# Patient Record
Sex: Male | Born: 1945 | Race: White | Hispanic: No | Marital: Married | Smoking: Former smoker
Health system: Southern US, Community
[De-identification: ages and names within clinical notes are randomized; demographics above are authoritative.]

## PROBLEM LIST (undated history)

## (undated) DIAGNOSIS — R1310 Dysphagia, unspecified: Secondary | ICD-10-CM

## (undated) DIAGNOSIS — K219 Gastro-esophageal reflux disease without esophagitis: Secondary | ICD-10-CM

## (undated) DIAGNOSIS — E7800 Pure hypercholesterolemia, unspecified: Secondary | ICD-10-CM

## (undated) DIAGNOSIS — I10 Essential (primary) hypertension: Secondary | ICD-10-CM

## (undated) DIAGNOSIS — T8859XA Other complications of anesthesia, initial encounter: Secondary | ICD-10-CM

## (undated) DIAGNOSIS — C329 Malignant neoplasm of larynx, unspecified: Secondary | ICD-10-CM

## (undated) DIAGNOSIS — E039 Hypothyroidism, unspecified: Secondary | ICD-10-CM

## (undated) HISTORY — DX: Dysphagia, unspecified: R13.10

## (undated) HISTORY — DX: Malignant neoplasm of larynx, unspecified: C32.9

## (undated) HISTORY — PX: OTHER SURGICAL HISTORY: SHX169

## (undated) HISTORY — DX: Hypothyroidism, unspecified: E03.9

## (undated) HISTORY — PX: HEMORRHOID SURGERY: SHX153

## (undated) HISTORY — DX: Pure hypercholesterolemia, unspecified: E78.00

---

## 2001-10-23 ENCOUNTER — Encounter: Payer: Self-pay | Admitting: Otolaryngology

## 2001-10-23 ENCOUNTER — Ambulatory Visit: Admission: RE | Admit: 2001-10-23 | Discharge: 2002-01-21 | Payer: Self-pay | Admitting: Radiation Oncology

## 2001-10-23 ENCOUNTER — Ambulatory Visit (HOSPITAL_COMMUNITY): Admission: RE | Admit: 2001-10-23 | Discharge: 2001-10-23 | Payer: Self-pay | Admitting: Otolaryngology

## 2001-10-31 ENCOUNTER — Encounter (INDEPENDENT_AMBULATORY_CARE_PROVIDER_SITE_OTHER): Payer: Self-pay | Admitting: Specialist

## 2001-10-31 ENCOUNTER — Inpatient Hospital Stay (HOSPITAL_COMMUNITY): Admission: RE | Admit: 2001-10-31 | Discharge: 2001-11-01 | Payer: Self-pay | Admitting: Otolaryngology

## 2002-01-08 ENCOUNTER — Encounter (HOSPITAL_COMMUNITY): Admission: RE | Admit: 2002-01-08 | Discharge: 2002-04-08 | Payer: Self-pay | Admitting: Dentistry

## 2002-01-10 ENCOUNTER — Encounter (HOSPITAL_COMMUNITY): Payer: Self-pay | Admitting: Dentistry

## 2002-04-02 ENCOUNTER — Encounter: Admission: RE | Admit: 2002-04-02 | Discharge: 2002-04-02 | Payer: Self-pay | Admitting: Otolaryngology

## 2002-04-02 ENCOUNTER — Encounter: Payer: Self-pay | Admitting: Otolaryngology

## 2002-08-02 ENCOUNTER — Ambulatory Visit (HOSPITAL_COMMUNITY): Admission: RE | Admit: 2002-08-02 | Discharge: 2002-08-02 | Payer: Self-pay | Admitting: Dentistry

## 2002-11-02 ENCOUNTER — Encounter: Payer: Self-pay | Admitting: Otolaryngology

## 2002-11-02 ENCOUNTER — Encounter: Admission: RE | Admit: 2002-11-02 | Discharge: 2002-11-02 | Payer: Self-pay | Admitting: Otolaryngology

## 2003-04-10 ENCOUNTER — Encounter: Admission: RE | Admit: 2003-04-10 | Discharge: 2003-04-10 | Payer: Self-pay | Admitting: Otolaryngology

## 2003-04-10 ENCOUNTER — Encounter: Payer: Self-pay | Admitting: Otolaryngology

## 2003-08-06 ENCOUNTER — Encounter (INDEPENDENT_AMBULATORY_CARE_PROVIDER_SITE_OTHER): Payer: Self-pay | Admitting: *Deleted

## 2003-08-06 ENCOUNTER — Observation Stay (HOSPITAL_COMMUNITY): Admission: RE | Admit: 2003-08-06 | Discharge: 2003-08-07 | Payer: Self-pay | Admitting: General Surgery

## 2003-08-07 ENCOUNTER — Encounter (INDEPENDENT_AMBULATORY_CARE_PROVIDER_SITE_OTHER): Payer: Self-pay | Admitting: Specialist

## 2003-11-07 ENCOUNTER — Encounter: Admission: RE | Admit: 2003-11-07 | Discharge: 2003-11-07 | Payer: Self-pay | Admitting: Otolaryngology

## 2004-06-16 ENCOUNTER — Encounter: Admission: RE | Admit: 2004-06-16 | Discharge: 2004-06-16 | Payer: Self-pay | Admitting: Otolaryngology

## 2004-11-24 ENCOUNTER — Encounter: Admission: RE | Admit: 2004-11-24 | Discharge: 2004-11-24 | Payer: Self-pay | Admitting: Otolaryngology

## 2008-02-06 ENCOUNTER — Ambulatory Visit (HOSPITAL_COMMUNITY): Admission: RE | Admit: 2008-02-06 | Discharge: 2008-02-06 | Payer: Self-pay | Admitting: General Surgery

## 2008-02-06 ENCOUNTER — Encounter (INDEPENDENT_AMBULATORY_CARE_PROVIDER_SITE_OTHER): Payer: Self-pay | Admitting: General Surgery

## 2009-06-12 IMAGING — CR DG ABD PORTABLE 1V
1 series · 1 of 1 positions shown · non-contrast
Comparison: none

HISTORY: Colonic polyps, colonoscopy, scope position

PORTABLE ABDOMEN ONE VIEW:
Portable exam 3333 hours.
Endoscope is coiled in left abdomen with tip in superior right upper quadrant at
expected position of hepatic flexure.
Air filled loops of bowel compatible with insufflation for endoscopy.
Bones unremarkable.

[view not recorded]
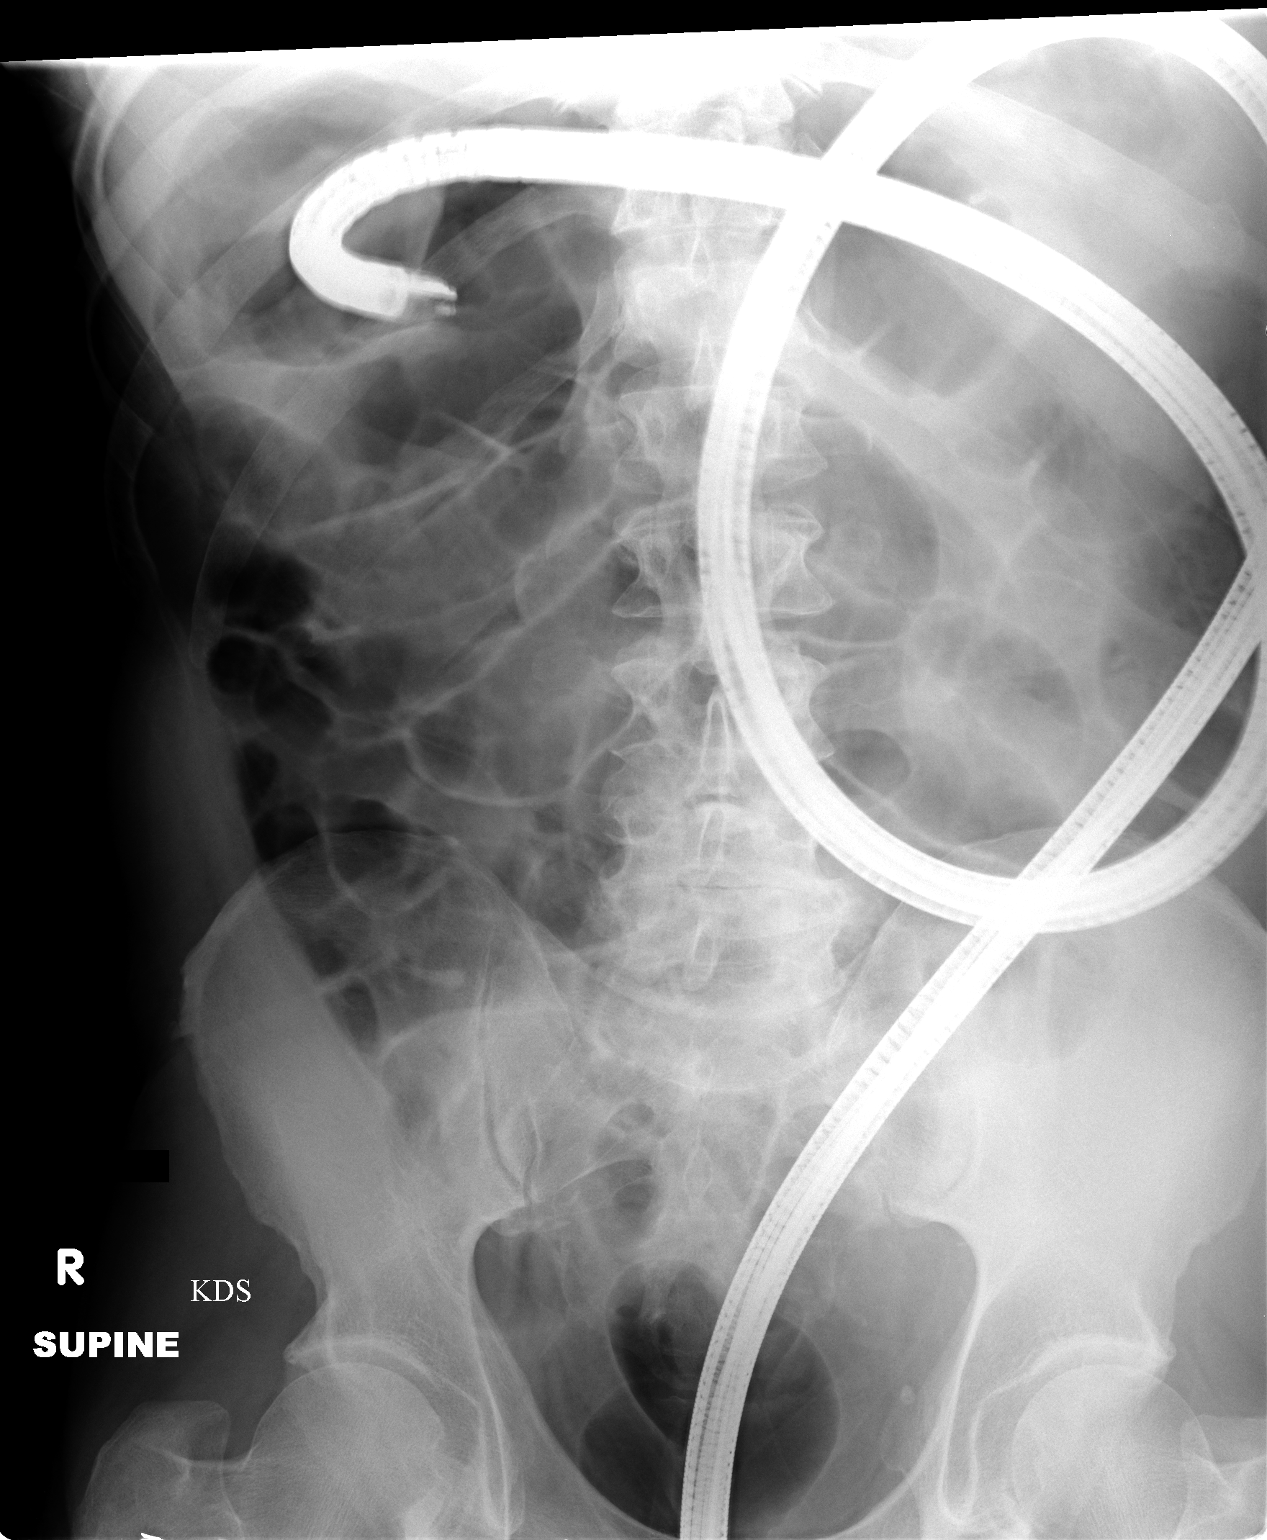

[1 of 1 positions shown; findings below may reference images not displayed]

IMPRESSION: Tip of colonoscoped is in the right upper quadrant projecting over the expected
position of the hepatic flexure.

## 2011-05-14 NOTE — Op Note (Signed)
NAME:  Justin Gates, Justin Gates                          ACCOUNT NO.:  [id number]   MEDICAL RECORD NO.:  [id number]                   PATIENT TYPE:  OBV   LOCATION:  0478                                 FACILITY:  [facility]   PHYSICIAN:  Timothy E. Earlene Plater, M.D.              DATE OF BIRTH:  [birth year]   DATE OF PROCEDURE:  08/06/2003  DATE OF DISCHARGE:                                 OPERATIVE REPORT   PREOPERATIVE DIAGNOSIS:  Internal and external hemorrhoids.   POSTOPERATIVE DIAGNOSIS:  Internal and external hemorrhoids.   PROCEDURE:  Complex hemorrhoidectomy.   SURGEON:  Timothy E. Earlene Plater, M.D.   ANESTHESIA:  General.   Mr. Dobler is 46, otherwise healthy.  He has been treated for carcinoma of  the larynx.  He has longstanding hemorrhoids with considerable protrusion,  bleeding, and mucus discharge.  He was long past due for colonoscopy, and we  have arranged for colonoscopy today, followed by surgical hemorrhoidectomy.  Colonoscopy was carried out by Barbette Hair. Arlyce Dice, M.D.  A miniscule polyp was  seen and no other real significant pathology.  The patient presents to the  operating room, is evaluated by anesthesia, identified, and the permit  signed.   He was taken to the operating room and placed supine, general endotracheal  anesthesia administered.  He was put in extended lithotomy, brought down to  the end of the table, and the perineum and anus carefully evaluated.  Hemorrhoids were fairly massive, particularly in the right anterior  position, where there was a large bloody hemorrhoid present.  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  He and I had  discussed in detail the options for treatment, and I have elected to proceed  with a PPH anopexy.  The anus was injected around and about with 0.25%  Marcaine mixed 2 mL to 20 mL.  This was massaged in well.  The anus was  gently  dilated and then using the operating anoscope, a suture of 2-0  Prolene was placed beneath the mucosa circumferentially around the rectal  mucosa at 4 cm proximal to the dentate line.  Then the [employer] stapling device  was opened.  The anvil was placed through the pursestring suture.  This was  tightened down and tied and then with careful application, the stapling  device was closed and fired and removed.  The ring of mucosa was checked.  It was a full 360 degrees.  The suture line was checked, and it was not  bleeding.  However, the redundant right anterior hemorrhoid was still  present distally and was quite large and bled easily.  I elected to excise  that in standard fashion and close that wound with a running 2-0 chromic  suture.  This was carefully checked, as was the stapled suture line.  There  was no bleeding and no complication.  Gelfoam gauze was placed in the rectal  canal.  A dry sterile external dressing was applied.  Counts were correct.  He tolerated it well, was awakened and taken to the recovery room in good  condition.  Because it is after 5 p.m., he lives out of town, I will keep  the patient overnight for observation.                                               Timothy E. Earlene Plater, M.D.    TED/MEDQ  D:  08/06/2003  T:  08/07/2003  Job:  [id number]

## 2011-05-14 NOTE — Op Note (Signed)
NAME:  Justin Gates, Justin Gates NO.:  [id number]   MEDICAL RECORD NO.:  [id number]                   PATIENT TYPE:  OIB   LOCATION:  2899                                 FACILITY:  [facility]   PHYSICIAN:  Charlynne Pander, D.D.S.          DATE OF BIRTH:  1946/11/01   DATE OF PROCEDURE:  08/02/2002  DATE OF DISCHARGE:                                 OPERATIVE REPORT   PREOPERATIVE DIAGNOSES:  1. 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  2. Status post radiation therapy.  3. Chronic periodontitis.  4. Chronic apical periodontitis.  5. Dental caries.  6. Significant tooth mobility.   POSTOPERATIVE DIAGNOSES:  1. 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  2. Status post radiation therapy.  3. Chronic periodontitis.  4. Chronic apical periodontitis.  5. Dental caries.  6. Significant tooth mobility.   PROCEDURES:  1. Dental examination.  2. Extraction of remaining teeth (tooth #1, 2, 3, 4, 5, 6, 8, 9, 10, 12, 15,     17, 19, 20, 21, 22, 23, 24, 25, 26, 27, 28, and 32).  3. Four quadrants of alveoloplasty.   SURGEON:  Charlynne Pander, D.D.S.   ASSISTANT:  Elliot Dally (Sales executive).   ANESTHESIA:  General anesthesia via nasoendotracheal tube.   MEDICATIONS:  1. Ampicillin 2.0 g IV prior to invasive dental procedures.  2. Three carpules each containing 36 mg of Xylocaine with 0.018 mg of     epinephrine as well as four carpules each containing 9 mg of Marcaine     with 0.009 mg of epinephrine.   SPECIMENS:  There were 23 teeth, which were discarded.   DRAINS/CULTURES:  None.   COMPLICATIONS:  None.   ESTIMATED BLOOD LOSS:  Less than 100 ml.   FLUIDS REPLACED:  1000 mg of lactated Ringer's solution.   INDICATIONS:  The patient was recently diagnosed with squamous cell  carcinoma of the glottic larynx.  Patient with airway compromise which  necessitated the emergent provision of radiation therapy.  The  patient was  then referred to dental medicine for evaluation of poor dentition after the  radiation therapy due to the airway compromise.  The patient would have had  full-mouth extractions prior to the radiation therapy if there had been time  to do so.  At the request of Dr. Kathrynn Running, the patient was evaluated and  treatment planned for the extraction of the remaining teeth with  alveoloplasty to achieve primary closure.  His treatment plan was formulated  to decrease the risks and complications associated with dental infection  affecting the potential radiation therapy complications, specifically  osteoradionecrosis.   OPERATIVE FINDINGS:  The patient was examined in operating room #1.  The  teeth were identified for extraction.  The patient was noted to have chronic  periodontitis, apical periodontitis, dental caries, and significant tooth  mobility.  These findings would contribute to  the potential risk for  complications associated with the previous radiation therapy and increase  the risk for osteoradionecrosis.  The patient was therefore treatment-  planned above and the procedure was as follows:   DESCRIPTION OF PROCEDURE:  The patient was brought to the main operating  room #1.  The patient was placed in the supine position on the operating  room table.  The patient was then intubated utilizing a nasoendotracheal  tube.  The patient was then prepped and draped in the usual sterile manner  for an oral surgical procedure.  A throat pack was placed at this time.  The  oral cavity was thoroughly examined with the findings as noted above.  The  patient was then ready for the oral surgical procedure as follows:   Local anesthesia was administered with the total utilization of three  carpules each containing 36 mg of Xylocaine with 0.018 mg of epinephrine as  well as four carpules each containing 9 mg of Marcaine with 0.009 mg of  epinephrine.  This local anesthesia was administered  throughout the two hour-  long procedure.   The maxillary quadrant was first approached.  Anesthesia was delivered as  previously described.  A 15 blade incision was made from the distal of the  tuberosity through the mesial of tooth #12.  A surgical flap was then  reflected.  The upper right quadrant teeth were then subluxated with a  series of straight elevators.  Tooth #1, 2, and 3 were removed with a 53R  forceps without complications.  Tooth #4, 5, 6, 8, 9, and 10 were then  removed with a 150 forceps without complications.  Alveoloplasty was then  performed utilizing a rongeur and bone file.  The surgical site was then  irrigated with copious amounts of sterile saline.  The tissues were then  approximated and trimmed appropriately.  The surgical site was then closed  from the maxillary right tuberosity through the mesial of tooth #8.   The maxillary left quadrant was then approached.  A 15 blade incision was  made from the distal of the tuberosity through the mesial of tooth #12.  A  surgical flap was then reflected.  Tooth #12 and 15 were then removed with a  150 forceps without complication.  Alveoloplasty was then performed using a  rongeur and bone file.  The surgical site was then irrigated with copious  amounts of sterile saline.  The tissues were then approximated and trimmed  appropriately.  The surgical site was then closed utilizing 3-0 chromic gut  suture in a continuous interrupted suture technique from the distal of the  tuberosity through the mesial of #9.  Two additional interrupted sutures  were placed in the maxillary anterior area to further close the surgical  site.   The mandibular quadrants were then approached.  Anesthesia was delivered as  previously described.  The mandibular left quadrant was first approached.  A  15 blade incision was made from the distal of #17 through the mesial of #27. A surgical flap was then reflected.  The mandibular teeth were  then  subluxated with a series of straight elevators.  Tooth #17 was then removed  with a 17 forceps without complications.  The coronal aspect of tooth  #19  was then removed with the 17 forceps.  Tooth #20, 21, 23, 23, 24, 25, and 26  were then removed with a 151 forceps without complications.  The roots  associated with tooth #9 were further  evaluated, and the roots were then  removed with a rongeur without complication.  Granulation tissue was removed  from the socket in the area of #19.  Alveoloplasty was then performed  utilizing a rongeur and bone file.  The surgical site was then irrigated  with copious amounts of sterile saline.  The tissues were then approximated  and trimmed appropriately.  The surgical site was then closed utilizing 3-0  chromic gut suture in a continuous interrupted suture technique from the  distal of #17 through the mesial of #24.   The mandibular right quadrant was then approached.  A 15 blade incision was  made from the distal of #32 through the mesial of #27.  A surgical flap was  then reflected.  Tooth #27 and 28 were then subluxated with a series of  straight elevators.  Tooth #28 was then removed with a 151 forceps without  complications.  Buccal and interseptal bone was then removed around tooth  #27 with a surgical handpiece and bur and sterile saline.  Tooth #27 was  then removed without complications with a 151 forceps.  Tooth #32 was then  identified and removed with a 17 forceps without complications.  Alveoloplasty was then performed utilizing a rongeur and bone file.  The  surgical site was then irrigated with copious amounts of sterile saline.  The surgical site was then closed from the distal of #32 through the mesial  of #25.  The entire mouth was then irrigated with copious amounts of sterile  saline.  The patient was examined for complications.  Seeing none, the  dental medicine procedure was deemed to be complete.  The throat pack was   removed at this time.  At the request of the anesthesia team, a series of 4  x 4 gauzes were placed to aid hemostasis.  An oral airway was placed at this  time.  Ketoralac 30 mg were given IV at this time to assist in pain control.  The patient was then handed over to the anesthesia team for final  disposition.  After an appropriate amount of time, the patient was extubated  and taken to the postanesthesia care unit with stable vital signs and good  oxygenation level.  All counts were correct for the dental medicine  procedure.   Please note:  The patient will be given one week's worth of antibiotic  therapy with amoxicillin 500 mg every eight hours for seven days.  We will  assess the amount of healing at this time and prescribe an additional week's  worth of antibiotics as indicated.  This will aid in the prevention of  osteoradionecrosis.  The patient will be given supplemental pain medication utilizing Percocet 5/325 mg taking one to two tablets every four to six  hours as needed for pain.                                               Charlynne Pander, D.D.S.    RFK/MEDQ  D:  08/02/2002  T:  08/06/2002  Job:  [id number]   cc:   Haskel Khan, M.D.  [address]  [place]  [place] [address]   Gloris Manchester. Lazarus Salines, M.D.

## 2011-05-14 NOTE — Op Note (Signed)
[place]  Patient:    Justin Gates, Justin Gates Visit Number: [id number] MRN: [id number]          Service Type: SUR Location: MICU 2106 01 Attending Physician:  Fernande Boyden Dictated by:   Gloris Manchester. Lazarus Salines, M.D. Proc. Date: 10/31/01 Admit Date:  10/31/2001 Discharge Date: 11/01/2001   CC:         Margaretmary Dys, M.D.  Dr. Liliane Bade   Operative Report  PREOPERATIVE DIAGNOSIS:  T1N0 left vocal cord carcinoma.  POSTOPERATIVE DIAGNOSIS:  T1N0 left vocal cord carcinoma.  PROCEDURE PERFORMED:  Microlaryngoscopy with biopsy and laser ablation of left vocal cord lesion, esophagoscopy, flexible bronchoscopy.  SURGEON:  Gloris Manchester. Lazarus Salines, M.D.  ANESTHESIA:  General mask converted to general orotracheal.  ESTIMATED BLOOD LOSS:  Minimal.  COMPLICATIONS:  None.  FINDINGS:  A dense bulky lesion with an epicenter in the anterior left vocal cord.  Extension up and around the anterior commissure on to the anterior right false and true vocal cords.  Extension up into the false vocal cord, especially anteriorly, ________ back on to the arachnoid into the depth of the ventricle and with what appears to be significant bulk effect below the free edge of the vocal cords 10-15 mm.  No other lesions identified on bronchoscopy or cervical esophagoscopy.  There is no neck adenopathy palpated. No other lesions palpated in the nasopharynx, oropharynx, hypopharynx, or oral cavity.  DESCRIPTION OF PROCEDURE:  With the patient in a comfortable supine position, general intravenous and mask anesthesia was administered.  At an appropriate level, the table was turned 90 degrees.  A rubber tooth guard was applied. The cervical esophagoscope was lubricated, easily passed into the hypopharynx through the cricopharyngeus, and to its full length with no significant findings.  The scope was withdrawn.  Following this, anesthesia intubating flexible bronchoscope was  introduced and passed between the vocal cords without difficulty.  It was inserted into the secondary bronchial openings in all orifices.  There was a small amount of cloudy phlegm in several locations, but no lesions identified.  The bronchoscope was removed.  Note, that the entire tracheal bronchial tree proper was clear.  At this point, the Dedo laryngoscope was introduced with the findings as described above.  The significant bulk of the lesion made it difficult to identify the anterior most extent and also its inferior extent.  Given the very tenuous airway, it was elected to do a partial laser ablation of the lesion to allow extubation postoperative airway, and airway hopefully stable into the initiation of radiation therapy.  The laryngoscope was removed.  The entire oral cavity, nasopharynx, oropharynx, hypopharynx, and neck were palpated with no findings.  At this point, the [facility] laryngoscope was introduced, and the patient was intubated with a #6 [place] laser endotracheal tube without difficulty. The cuffs were inflated with saline and ventilation was assumed per orotracheal tube.  The Dedo laryngoscope was introduced once again, passed into the endolarynx, and suspended in the standard fashion.  Note that before suspending it, examination of the valleculae of the lingual and laryngeal surface of the epiglottis of the piriformis in the post cricoid region was performed with no significant findings.  The laryngoscope was suspended.  The patient had saline saturated eye pads in place.  The entire face and field was draped out in water moistened towels.  All personnel in the room had protective eye gear. The laser had been previously tested for aim and function.  A 4% cocaine  pledget had been placed initially, and at this point, this was removed and a saline saturated pledget was placed in the subglottis to protect the tube cuffs.  Using the laser in the 15 watt  continuous setting, debulking of the lesion was accomplished, predominantly in the left anterior false vocal cord, true vocal cord, and around the anterior commissure to some degree where it was quite bulky.  Biopsies were taken of this region before beginning the laser ablation.  The ablation was carried down to the bulk of the true cord into the subglottic area.  There was a thick rim of tissue adjacent to the endotracheal tube which could not easily be cleared in this fashion. Hemostasis was spontaneous.  After clearing the anterior area thoroughly, a cocaine moistened pledget was placed once again for hemostasis.  This was allowed to remain in place several minutes.  This was removed.  With anesthesia in cooperation and preparing for mask ventilation, the patient was extubated so that the endolarynx could be inspected once again with the findings as described above.  The patient was moving air spontaneously and much more freely than prior to the laser ablation.  The patient was returned to anesthesia, awakened, and transferred to recovery in stable condition.  COMMENTS:  A 65 year old white male with a relatively modest smoking history which he stopped four years ago.  Now, with a four month history of progressive hoarseness and recently some progressive dyspnea, and was the indication for todays procedure.  Will await a final pathology report.  This could possibly be consistent with papillomatosis, but the extensive disease and the invasive disease suggest carcinoma as the proper diagnosis.  The patient is on schedule to begin radiation therapy next week pending a positive pathologic diagnosis.  Will observe him for 23 hours to make sure that his airway is stable and then get him home.  Cover him with a liquid diet, liquid analgesics, and antireflux agents. Dictated by:   Gloris Manchester. Lazarus Salines, M.D. Attending Physician:  Fernande Boyden DD:  10/31/01  TD:  11/01/01 Job:  [id number]

## 2012-10-23 ENCOUNTER — Encounter: Payer: Self-pay | Admitting: Radiation Oncology

## 2012-10-27 ENCOUNTER — Encounter: Payer: Self-pay | Admitting: Radiation Oncology

## 2012-11-16 ENCOUNTER — Encounter: Payer: Self-pay | Admitting: Radiation Oncology

## 2012-12-04 ENCOUNTER — Encounter: Payer: Self-pay | Admitting: Radiation Oncology

## 2013-03-21 ENCOUNTER — Telehealth: Payer: Self-pay | Admitting: Radiation Oncology

## 2013-03-21 NOTE — Telephone Encounter (Signed)
Received record request from [facility].  Patient treated in [place].  Faxed to [place]. Received confirmation.

## 2013-03-23 ENCOUNTER — Telehealth: Payer: Self-pay | Admitting: Radiation Oncology

## 2013-03-23 NOTE — Telephone Encounter (Signed)
Received large fax from [place] (medical records).  Mailed to the [place].

## 2018-11-14 ENCOUNTER — Encounter (INDEPENDENT_AMBULATORY_CARE_PROVIDER_SITE_OTHER): Payer: Self-pay | Admitting: Internal Medicine

## 2018-11-14 ENCOUNTER — Encounter (INDEPENDENT_AMBULATORY_CARE_PROVIDER_SITE_OTHER): Payer: Self-pay | Admitting: *Deleted

## 2018-11-14 ENCOUNTER — Ambulatory Visit (INDEPENDENT_AMBULATORY_CARE_PROVIDER_SITE_OTHER): Payer: No Typology Code available for payment source | Admitting: Internal Medicine

## 2018-11-14 DIAGNOSIS — R1319 Other dysphagia: Secondary | ICD-10-CM | POA: Insufficient documentation

## 2018-11-14 DIAGNOSIS — R1310 Dysphagia, unspecified: Secondary | ICD-10-CM | POA: Insufficient documentation

## 2018-11-14 HISTORY — DX: Dysphagia, unspecified: R13.10

## 2018-11-14 NOTE — Patient Instructions (Signed)
The risks of bleeding, perforation and infection were reviewed with patient.  

## 2018-11-14 NOTE — Progress Notes (Signed)
   Subjective:    Patient ID: Justin Gates, male    DOB: 1946-10-24, 72 y.o.   MRN: [id number] PCP Dr. Laurance Flatten HPI Referred by the [facility] for dysphagia.  Hx of laryngeal malignancy with chronic dysphagia (2002 ,stage 4 per records). Did receive radiation at [facility] and [facility] 2002 (35 rounds).  Per Epic T1NO left focal cord carcinoma. Procedure: Marco laryngoscopy with biopsy and laser ablation of left vocal cord lesion, EGD and flexible bronchoscopy. Dr. Erik Obey. He says he underwent laryngoscopy in 2018 (ENT). Dr. Charleston Ropes in [place].  Per records ENT consulted and veteran completed barium swallow study which was normal.  No further recommendations.  Hx of exposure to agent orange and developed laryngeal cancer. He tells me today he is having dysphagia. He says he is having trouble swallowing his pills.  He stopped eating steak because it was hard to swallow. He has had symptoms for a year or more. His appetite is good. No weight loss. No abdominal pain. BMs are normal.   Last colonoscopy 2016 at the [facility], Dr. Gaye Pollack,  Single polyp (no biopsy). Wife had this information in a notebook.  11/03/2018 ALT 26,  H and H 13.6 and 40.8. HA1C 6.8 05/24/2018 Biphasic esophagram: normal exam.  Review of Systems Past Medical History:  Diagnosis Date  . Dysphagia 11/14/2018  . High cholesterol   . Hypothyroid   . Laryngeal cancer ([place])       No Known Allergies  Current Outpatient Medications on File Prior to Visit  Medication Sig Dispense Refill  . amLODipine (NORVASC) 5 MG tablet Take 5 mg by mouth daily.    Marland Kitchen aspirin EC 81 MG tablet Take 81 mg by mouth daily.    Marland Kitchen atorvastatin (LIPITOR) 40 MG tablet Take 40 mg by mouth daily.    . carboxymethylcellulose (REFRESH PLUS) 0.5 % SOLN 1 drop daily as needed.    . famotidine (PEPCID) 10 MG tablet Take 10 mg by mouth 2 (two) times daily.    . Garlic 4562 MG CAPS Take by mouth.    Marland Kitchen guaiFENesin-Codeine (GUAIFEN-C PO) Take 600 mg by  mouth 2 (two) times daily.    Marland Kitchen levothyroxine (SYNTHROID, LEVOTHROID) 137 MCG tablet Take 137 mcg by mouth daily before breakfast.     No current facility-administered medications on file prior to visit.         Objective:   Physical Exam Blood pressure 120/68, pulse 60, temperature (!) 97.5 F (36.4 C), height 5\' 11"  (1.803 m), weight 200 lb (90.7 kg). Alert and oriented. Skin warm and dry. Oral mucosa is moist. Full dentures  . Sclera anicteric, conjunctivae is pink. Thyroid not enlarged. No cervical lymphadenopathy. Lungs clear. Heart regular rate and rhythm.  Abdomen is soft. Bowel sounds are positive. No hepatomegaly. No abdominal masses felt. No tenderness.  No edema to lower extremities. Patient is alert and oriented.         Assessment & Plan:  Dysphagia: Needs EGD/ED. Possible radiation induced stricture.  The risks of bleeding, perforation and infection were reviewed with patient.

## 2018-11-16 ENCOUNTER — Ambulatory Visit (INDEPENDENT_AMBULATORY_CARE_PROVIDER_SITE_OTHER): Payer: Non-veteran care | Admitting: Internal Medicine

## 2019-01-18 ENCOUNTER — Encounter (HOSPITAL_COMMUNITY)
Admission: RE | Disposition: A | Discharge status: Home / Self Care | Payer: Self-pay | Source: Home / Self Care | Attending: Internal Medicine

## 2019-01-18 ENCOUNTER — Other Ambulatory Visit: Payer: Self-pay

## 2019-01-18 ENCOUNTER — Encounter (HOSPITAL_COMMUNITY): Payer: Self-pay | Admitting: *Deleted

## 2019-01-18 ENCOUNTER — Ambulatory Visit (HOSPITAL_COMMUNITY)
Admission: RE | Admit: 2019-01-18 | Discharge: 2019-01-18 | Disposition: A | Discharge status: Home / Self Care | Payer: No Typology Code available for payment source | Attending: Internal Medicine | Admitting: Internal Medicine

## 2019-01-18 DIAGNOSIS — R1319 Other dysphagia: Secondary | ICD-10-CM | POA: Insufficient documentation

## 2019-01-18 DIAGNOSIS — Z79890 Hormone replacement therapy (postmenopausal): Secondary | ICD-10-CM | POA: Insufficient documentation

## 2019-01-18 DIAGNOSIS — E7800 Pure hypercholesterolemia, unspecified: Secondary | ICD-10-CM | POA: Insufficient documentation

## 2019-01-18 DIAGNOSIS — Z8521 Personal history of malignant neoplasm of larynx: Secondary | ICD-10-CM | POA: Insufficient documentation

## 2019-01-18 DIAGNOSIS — E039 Hypothyroidism, unspecified: Secondary | ICD-10-CM | POA: Diagnosis not present

## 2019-01-18 DIAGNOSIS — Z79899 Other long term (current) drug therapy: Secondary | ICD-10-CM | POA: Diagnosis not present

## 2019-01-18 DIAGNOSIS — R1310 Dysphagia, unspecified: Secondary | ICD-10-CM

## 2019-01-18 DIAGNOSIS — K2990 Gastroduodenitis, unspecified, without bleeding: Secondary | ICD-10-CM

## 2019-01-18 DIAGNOSIS — K2980 Duodenitis without bleeding: Secondary | ICD-10-CM

## 2019-01-18 DIAGNOSIS — K210 Gastro-esophageal reflux disease with esophagitis: Secondary | ICD-10-CM | POA: Diagnosis not present

## 2019-01-18 DIAGNOSIS — Z7982 Long term (current) use of aspirin: Secondary | ICD-10-CM | POA: Diagnosis not present

## 2019-01-18 DIAGNOSIS — K3189 Other diseases of stomach and duodenum: Secondary | ICD-10-CM

## 2019-01-18 DIAGNOSIS — R1314 Dysphagia, pharyngoesophageal phase: Secondary | ICD-10-CM | POA: Insufficient documentation

## 2019-01-18 DIAGNOSIS — K449 Diaphragmatic hernia without obstruction or gangrene: Secondary | ICD-10-CM | POA: Diagnosis not present

## 2019-01-18 DIAGNOSIS — I10 Essential (primary) hypertension: Secondary | ICD-10-CM | POA: Insufficient documentation

## 2019-01-18 DIAGNOSIS — K228 Other specified diseases of esophagus: Secondary | ICD-10-CM | POA: Insufficient documentation

## 2019-01-18 DIAGNOSIS — Z923 Personal history of irradiation: Secondary | ICD-10-CM | POA: Insufficient documentation

## 2019-01-18 HISTORY — PX: ESOPHAGOGASTRODUODENOSCOPY: SHX5428

## 2019-01-18 HISTORY — DX: Gastro-esophageal reflux disease without esophagitis: K21.9

## 2019-01-18 HISTORY — DX: Essential (primary) hypertension: I10

## 2019-01-18 HISTORY — PX: ESOPHAGEAL DILATION: SHX303

## 2019-01-18 SURGERY — EGD (ESOPHAGOGASTRODUODENOSCOPY)
Anesthesia: Moderate Sedation

## 2019-01-18 MED ORDER — MEPERIDINE HCL 50 MG/ML IJ SOLN
INTRAMUSCULAR | Status: DC | PRN
  Administered 2019-01-18 (×2): 25 mg via INTRAVENOUS

## 2019-01-18 MED ORDER — MEPERIDINE HCL 50 MG/ML IJ SOLN
INTRAMUSCULAR | Status: AC
  Filled 2019-01-18: qty 1

## 2019-01-18 MED ORDER — MIDAZOLAM HCL 5 MG/5ML IJ SOLN
INTRAMUSCULAR | Status: AC
  Filled 2019-01-18: qty 10

## 2019-01-18 MED ORDER — LIDOCAINE VISCOUS HCL 2 % MT SOLN
OROMUCOSAL | Status: DC | PRN
  Administered 2019-01-18: 5 mL via OROMUCOSAL

## 2019-01-18 MED ORDER — SODIUM CHLORIDE 0.9 % IV SOLN
INTRAVENOUS | Status: DC
  Administered 2019-01-18: 13:00:00 via INTRAVENOUS

## 2019-01-18 MED ORDER — LIDOCAINE VISCOUS HCL 2 % MT SOLN
OROMUCOSAL | Status: AC
  Filled 2019-01-18: qty 15

## 2019-01-18 MED ORDER — STERILE WATER FOR IRRIGATION IR SOLN
Status: DC | PRN
  Administered 2019-01-18: 2.5 mL

## 2019-01-18 MED ORDER — MIDAZOLAM HCL 5 MG/5ML IJ SOLN
INTRAMUSCULAR | Status: DC | PRN
  Administered 2019-01-18 (×2): 2 mg via INTRAVENOUS
  Administered 2019-01-18: 1 mg via INTRAVENOUS

## 2019-01-18 MED ORDER — PANTOPRAZOLE SODIUM 40 MG PO TBEC: 40.0000 mg | DELAYED_RELEASE_TABLET | Freq: Every day | ORAL | 1 refills | Status: DC

## 2019-01-18 NOTE — Discharge Instructions (Signed)
Discontinue famotidine. Resume other medications as before. Pantoprazole  40 mg by mouth 30 minutes before breakfast daily. Resume usual diet. Please call office with progress report in 1 week.    Upper Endoscopy, Adult, Care After This sheet gives you information about how to care for yourself after your procedure. Your health care provider may also give you more specific instructions. If you have problems or questions, contact your health care provider. What can I expect after the procedure? After the procedure, it is common to have:  A sore throat.  Mild stomach pain or discomfort.  Bloating.  Nausea. Follow these instructions at home:   Follow instructions from your health care provider about what to eat or drink after your procedure.  Return to your normal activities as told by your health care provider. Ask your health care provider what activities are safe for you.  Take over-the-counter and prescription medicines only as told by your health care provider.  Do not drive for 24 hours if you were given a sedative during your procedure.  Keep all follow-up visits as told by your health care provider. This is important. Contact a health care provider if you have:  A sore throat that lasts longer than one day.  Trouble swallowing. Get help right away if:  You vomit blood or your vomit looks like coffee grounds.  You have: ? A fever. ? Bloody, black, or tarry stools. ? A severe sore throat or you cannot swallow. ? Difficulty breathing. ? Severe pain in your chest or abdomen. Summary  After the procedure, it is common to have a sore throat, mild stomach discomfort, bloating, and nausea.  Do not drive for 24 hours if you were given a sedative during the procedure.  Follow instructions from your health care provider about what to eat or drink after your procedure.  Return to your normal activities as told by your health care provider. This information is not  intended to replace advice given to you by your health care provider. Make sure you discuss any questions you have with your health care provider. Document Released: 06/13/2012 Document Revised: 05/15/2018 Document Reviewed: 05/15/2018 Elsevier Interactive Patient Education  2019 [employer]   Esophageal Dilatation Esophageal dilatation, also called esophageal dilation, is a procedure to widen or open (dilate) a blocked or narrowed part of the esophagus. The esophagus is the part of the body that moves food and liquid from the mouth to the stomach. You may need this procedure if: You have a buildup of scar tissue in your esophagus that makes it difficult, painful, or impossible to swallow. This can be caused by gastroesophageal reflux disease (GERD). You have cancer of the esophagus. There is a problem with how food moves through your esophagus. In some cases, you may need this procedure repeated at a later time to dilate the esophagus gradually. Tell a health care provider about: Any allergies you have. All medicines you are taking, including vitamins, herbs, eye drops, creams, and over-the-counter medicines. Any problems you or family members have had with anesthetic medicines. Any blood disorders you have. Any surgeries you have had. Any medical conditions you have. Any antibiotic medicines you are required to take before dental procedures. Whether you are pregnant or may be pregnant. What are the risks? Generally, this is a safe procedure. However, problems may occur, including: Bleeding due to a tear in the lining of the esophagus. A hole (perforation) in the esophagus. What happens before the procedure? Follow instructions from your  health care provider about eating or drinking restrictions. Ask your health care provider about changing or stopping your regular medicines. This is especially important if you are taking diabetes medicines or blood thinners. Plan to have someone take  you home from the hospital or clinic. Plan to have a responsible adult care for you for at least 24 hours after you leave the hospital or clinic. This is important. What happens during the procedure? You may be given a medicine to help you relax (sedative). A numbing medicine may be sprayed into the back of your throat, or you may gargle the medicine. Your health care provider may perform the dilatation using various surgical instruments, such as: Simple dilators. This instrument is carefully placed in the esophagus to stretch it. Guided wire bougies. This involves using an endoscope to insert a wire into the esophagus. A dilator is passed over this wire to enlarge the esophagus. Then the wire is removed. Balloon dilators. An endoscope with a small balloon at the end is inserted into the esophagus. The balloon is inflated to stretch the esophagus and open it up. The procedure may vary among health care providers and hospitals. What happens after the procedure? Your blood pressure, heart rate, breathing rate, and blood oxygen level will be monitored until the medicines you were given have worn off. Your throat may feel slightly sore and numb. This will improve slowly over time. You will not be allowed to eat or drink until your throat is no longer numb. When you are able to drink, urinate, and sit on the edge of the bed without nausea or dizziness, you may be able to return home. Follow these instructions at home: Take over-the-counter and prescription medicines only as told by your health care provider. Do not drive for 24 hours if you were given a sedative during your procedure. You should have a responsible adult with you for 24 hours after the procedure. Follow instructions from your health care provider about any eating or drinking restrictions. Do not use any products that contain nicotine or tobacco, such as cigarettes and e-cigarettes. If you need help quitting, ask your health care  provider. Keep all follow-up visits as told by your health care provider. This is important. Get help right away if you: Have a fever. Have chest pain. Have pain that is not relieved by medication. Have trouble breathing. Have trouble swallowing. Vomit blood. Summary Esophageal dilatation, also called esophageal dilation, is a procedure to widen or open (dilate) a blocked or narrowed part of the esophagus. Plan to have someone take you home from the hospital or clinic. For this procedure, a numbing medicine may be sprayed into the back of your throat, or you may gargle the medicine. Do not drive for 24 hours if you were given a sedative during your procedure. This information is not intended to replace advice given to you by your health care provider. Make sure you discuss any questions you have with your health care provider. Document Released: 02/03/2006 Document Revised: 10/18/2017 Document Reviewed: 10/18/2017 Elsevier Interactive Patient Education  2019 [employer]   Hiatal Hernia  A hiatal hernia occurs when part of the stomach slides above the muscle that separates the abdomen from the chest (diaphragm). A person can be born with a hiatal hernia (congenital), or it may develop over time. In almost all cases of hiatal hernia, only the top part of the stomach pushes through the diaphragm. Many people have a hiatal hernia with no symptoms. The larger  the hernia, the more likely it is that you will have symptoms. In some cases, a hiatal hernia allows stomach acid to flow back into the tube that carries food from your mouth to your stomach (esophagus). This may cause heartburn symptoms. Severe heartburn symptoms may mean that you have developed a condition called gastroesophageal reflux disease (GERD). What are the causes? This condition is caused by a weakness in the opening (hiatus) where the esophagus passes through the diaphragm to attach to the upper part of the stomach. A person  may be born with a weakness in the hiatus, or a weakness can develop over time. What increases the risk? This condition is more likely to develop in:  Older people. Age is a major risk factor for a hiatal hernia, especially if you are over the age of 12.  Pregnant women.  People who are overweight.  People who have frequent constipation. What are the signs or symptoms? Symptoms of this condition usually develop in the form of GERD symptoms. Symptoms include:  Heartburn.  Belching.  Indigestion.  Trouble swallowing.  Coughing or wheezing.  Sore throat.  Hoarseness.  Chest pain.  Nausea and vomiting. How is this diagnosed? This condition may be diagnosed during testing for GERD. Tests that may be done include:  X-rays of your stomach or chest.  An upper gastrointestinal (GI) series. This is an X-ray exam of your GI tract that is taken after you swallow a chalky liquid that shows up clearly on the X-ray.  Endoscopy. This is a procedure to look into your stomach using a thin, flexible tube that has a tiny camera and light on the end of it. How is this treated? This condition may be treated by:  Dietary and lifestyle changes to help reduce GERD symptoms.  Medicines. These may include: ? Over-the-counter antacids. ? Medicines that make your stomach empty more quickly. ? Medicines that block the production of stomach acid (H2 blockers). ? Stronger medicines to reduce stomach acid (proton pump inhibitors).  Surgery to repair the hernia, if other treatments are not helping. If you have no symptoms, you may not need treatment. Follow these instructions at home: Lifestyle and activity  Do not use any products that contain nicotine or tobacco, such as cigarettes and e-cigarettes. If you need help quitting, ask your health care provider.  Try to achieve and maintain a healthy body weight.  Avoid putting pressure on your abdomen. Anything that puts pressure on your  abdomen increases the amount of acid that may be pushed up into your esophagus. ? Avoid bending over, especially after eating. ? Raise the head of your bed by putting blocks under the legs. This keeps your head and esophagus higher than your stomach. ? Do not wear tight clothing around your chest or stomach. ? Try not to strain when having a bowel movement, when urinating, or when lifting heavy objects. Eating and drinking  Avoid foods that can worsen GERD symptoms. These may include: ? Fatty foods, like fried foods. ? Citrus fruits, like oranges or lemon. ? Other foods and drinks that contain acid, like orange juice or tomatoes. ? Spicy food. ? Chocolate.  Eat frequent small meals instead of three large meals a day. This helps prevent your stomach from getting too full. ? Eat slowly. ? Do not lie down right after eating. ? Do not eat 1-2 hours before bed.  Do not drink beverages with caffeine. These include cola, coffee, cocoa, and tea.  Do not drink alcohol.  General instructions  Take over-the-counter and prescription medicines only as told by your health care provider.  Keep all follow-up visits as told by your health care provider. This is important. Contact a health care provider if:  Your symptoms are not controlled with medicines or lifestyle changes.  You are having trouble swallowing.  You have coughing or wheezing that will not go away. Get help right away if:  Your pain is getting worse.  Your pain spreads to your arms, neck, jaw, teeth, or back.  You have shortness of breath.  You sweat for no reason.  You feel sick to your stomach (nauseous) or you vomit.  You vomit blood.  You have bright red blood in your stools.  You have black, tarry stools. This information is not intended to replace advice given to you by your health care provider. Make sure you discuss any questions you have with your health care provider. Document Released: 03/04/2004 Document  Revised: 07/18/2017 Document Reviewed: 07/18/2017 Elsevier Interactive Patient Education  2019 [employer] Inc.  PATIENT INSTRUCTIONS POST-ANESTHESIA  IMMEDIATELY FOLLOWING SURGERY:  Do not drive or operate machinery for the first twenty four hours after surgery.  Do not make any important decisions for twenty four hours after surgery or while taking narcotic pain medications or sedatives.  If you develop intractable nausea and vomiting or a severe headache please notify your doctor immediately.  FOLLOW-UP:  Please make an appointment with your surgeon as instructed. You do not need to follow up with anesthesia unless specifically instructed to do so.  WOUND CARE INSTRUCTIONS (if applicable):  Keep a dry clean dressing on the anesthesia/puncture wound site if there is drainage.  Once the wound has quit draining you may leave it open to air.  Generally you should leave the bandage intact for twenty four hours unless there is drainage.  If the epidural site drains for more than 36-48 hours please call the anesthesia department.  QUESTIONS?:  Please feel free to call your physician or the hospital operator if you have any questions, and they will be happy to assist you.

## 2019-01-18 NOTE — Op Note (Signed)
[facility] Patient Name: Justin Gates Procedure Date: 01/18/2019 12:43 PM MRN: [id number] Date of Birth: 1946-02-18 Attending MD: Hildred Laser , MD CSN: [id number] Age: 73 Admit Type: Outpatient Procedure:                Upper GI endoscopy Indications:              Esophageal dysphagia Providers:                Hildred Laser, MD, Jeanann Lewandowsky. Sharon Seller, RN, Aram Candela Referring MD:             Patient clinic, [facility], [place]. Medicines:                Lidocaine spray, Meperidine 50 mg IV, Midazolam 5                            mg IV Complications:            No immediate complications. Estimated Blood Loss:     Estimated blood loss: none. Procedure:                Pre-Anesthesia Assessment:                           - Prior to the procedure, a History and Physical                            was performed, and patient medications and                            allergies were reviewed. The patient's tolerance of                            previous anesthesia was also reviewed. The risks                            and benefits of the procedure and the sedation                            options and risks were discussed with the patient.                            All questions were answered, and informed consent                            was obtained. Prior Anticoagulants: The patient                            last took aspirin 7 days prior to the procedure.                            ASA Grade Assessment: II - A patient with mild  systemic disease. After reviewing the risks and                            benefits, the patient was deemed in satisfactory                            condition to undergo the procedure.                           After obtaining informed consent, the endoscope was                            passed under direct vision. Throughout the                            procedure, the patient's blood  pressure, pulse, and                            oxygen saturations were monitored continuously. The                            GIF-H190 ([id number]) scope was introduced through the                            mouth, and advanced to the second part of duodenum.                            The upper GI endoscopy was accomplished without                            difficulty. The patient tolerated the procedure                            well. Scope In: 1:22:31 PM Scope Out: 1:36:37 PM Total Procedure Duration: 0 hours 14 minutes 6 seconds  Findings:      The hypopharynx was normal.      LA Grade A (one or more mucosal breaks less than 5 mm, not extending       between tops of 2 mucosal folds) esophagitis with no bleeding was found       40 cm from the incisors.      The Z-line was irregular and was found 40 cm from the incisors.      A 2 cm hiatal hernia was present.      No endoscopic abnormality was evident in the esophagus to explain the       patient's complaint of dysphagia. It was decided, however, to proceed       with dilation of the entire esophagus. The scope was withdrawn. Dilation       was performed with a Maloney dilator with mild resistance at 24 Fr, 46       Fr, 50 Fr and 54 Fr. The dilation site was examined following endoscope       reinsertion and showed no change and no bleeding, mucosal tear or       perforation.      Two erosions were found in the prepyloric  region of the stomach.      The exam of the stomach was otherwise normal.      Patchy mild inflammation characterized by congestion (edema), erosions       and erythema was found in the duodenal bulb.      The second portion of the duodenum was normal. Impression:               - Normal hypopharynx.                           - LA Grade A reflux esophagitis.                           - Z-line irregular, 40 cm from the incisors.                           - 2 cm hiatal hernia.                           - No  endoscopic esophageal abnormality to explain                            patient's dysphagia. Esophagus dilated. Dilated.                           - Erosive gastropathy.                           - Duodenitis.                           - Normal second portion of the duodenum.                           - No specimens collected. Moderate Sedation:      Moderate (conscious) sedation was administered by the endoscopy nurse       and supervised by the endoscopist. The following parameters were       monitored: oxygen saturation, heart rate, blood pressure, CO2       capnography and response to care. Total physician intraservice time was       15 minutes. Recommendation:           - Patient has a contact number available for                            emergencies. The signs and symptoms of potential                            delayed complications were discussed with the                            patient. Return to normal activities tomorrow.                            Written discharge instructions were provided to the  patient.                           - Resume previous diet today.                           - Continue present medications except stop                            Famotidine.                           - Use Protonix (pantoprazole) 40 mg PO daily today.                           - Perform an H. pylori serology today.                           - Telephone GI clinic in 1 week. Procedure Code(s):        --- Professional ---                           [phone], Esophagogastroduodenoscopy, flexible,                            transoral; diagnostic, including collection of                            specimen(s) by brushing or washing, when performed                            (separate procedure)                           43450, Dilation of esophagus, by unguided sound or                            bougie, single or multiple passes                            G0500, Moderate sedation services provided by the                            same physician or other qualified health care                            professional performing a gastrointestinal                            endoscopic service that sedation supports,                            requiring the presence of an independent trained                            observer to assist in the monitoring of the  patient's level of consciousness and physiological                            status; initial 15 minutes of intra-service time;                            patient age 74 years or older (additional time may                            be reported with [phone], as appropriate) Diagnosis Code(s):        --- Professional ---                           K21.0, Gastro-esophageal reflux disease with                            esophagitis                           K22.8, Other specified diseases of esophagus                           K44.9, Diaphragmatic hernia without obstruction or                            gangrene                           K31.89, Other diseases of stomach and duodenum                           K29.80, Duodenitis without bleeding                           R13.14, Dysphagia, pharyngoesophageal phase CPT copyright 2018 American [employer]. All rights reserved. The codes documented in this report are preliminary and upon coder review may  be revised to meet current compliance requirements. Hildred Laser, MD Hildred Laser, MD 01/18/2019 1:54:07 PM This report has been signed electronically. Number of Addenda: 0

## 2019-01-18 NOTE — H&P (Signed)
Justin Gates is an 73 y.o. male.   Chief Complaint: Patient is here for EGD and ED. HPI: Patient 73 year old Caucasian male who presents with 6 months history of dysphagia primarily to solids and pills.  He points to the suprasternal area sort of bolus obstruction.  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  He has occasional heartburn.  He takes famotidine 10 mg daily.  He says of food bolus eventually passes down but he has to regurgitate pills before he gets relief.  He denies nausea vomiting melena epigastric pain or weight loss. Last aspirin dose was 1 week ago.  Past Medical History:  Diagnosis Date  . Dysphagia 11/14/2018  . GERD (gastroesophageal reflux disease)   . High cholesterol   . Hypertension   . Hypothyroid   . Laryngeal cancer [facility]) status post radiation therapy.      Allergies: No Known Allergies  Medications Prior to Admission  Medication Sig Dispense Refill  . amLODipine (NORVASC) 10 MG tablet Take 5 mg by mouth daily.    Marland Kitchen aspirin EC 81 MG tablet Take 81 mg by mouth daily at 3 pm.     . atorvastatin (LIPITOR) 40 MG tablet Take 40 mg by mouth every evening.     . carboxymethylcellulose (REFRESH PLUS) 0.5 % SOLN Place 2-3 drops into both eyes daily.     . famotidine (PEPCID) 20 MG tablet Take 10 mg by mouth daily before breakfast.    . Garlic 1610 MG CAPS Take 1,000 mg by mouth every evening.     Marland Kitchen guaiFENesin (MUCINEX) 600 MG 12 hr tablet Take 1,200 mg by mouth 2 (two) times daily.    Marland Kitchen levothyroxine (SYNTHROID, LEVOTHROID) 137 MCG tablet Take 137 mcg by mouth daily before breakfast.    . Multiple Vitamin (MULTIVITAMIN WITH MINERALS) TABS tablet Take 1 tablet by mouth daily. MULTIVITAMIN FOR MEN 50+    . PSYLLIUM HUSK PO Take 4 g by mouth daily with lunch.    . TEA TREE OIL EX Apply 1 application topically every Monday. MIX WITH BABY LOTION (APPLY TO EYE ONCE A WEEK)      No results found for this or  any previous visit (from the past 4 hour(s)). No results found.  ROS  Blood pressure (!) 168/88, pulse (!) 51, temperature 98.3 F (36.8 C), temperature source Oral, resp. rate 20, height 5\' 11"  (1.803 m), weight 90.7 kg, SpO2 99 %. Physical Exam  Constitutional: He appears well-developed and well-nourished.  HENT:  Mouth/Throat: Oropharynx is clear and moist.  He is edentulous.  He uses upper and lower dentures.  Eyes: Conjunctivae are normal. No scleral icterus.  Neck: No thyromegaly present.  Cardiovascular: Normal rate, regular rhythm and normal heart sounds.  No murmur heard. Respiratory: Effort normal and breath sounds normal.  GI: Soft. He exhibits no distension and no mass.  Small umbilical hernia.  Musculoskeletal:        General: No edema.  Neurological: He is alert.  Skin: Skin is warm and dry.     Assessment/Plan Esophageal dysphagia. History of radiation therapy for laryngeal carcinoma. EGD with ED.  Hildred Laser, MD 01/18/2019, 1:10 PM

## 2019-01-19 LAB — H. PYLORI ANTIBODY, IGG: H Pylori IgG: 0.78 Index Value (ref 0.00–0.79)

## 2019-01-22 ENCOUNTER — Encounter (HOSPITAL_COMMUNITY): Payer: Self-pay | Admitting: Internal Medicine

## 2019-01-23 ENCOUNTER — Telehealth (INDEPENDENT_AMBULATORY_CARE_PROVIDER_SITE_OTHER): Payer: Self-pay | Admitting: Internal Medicine

## 2019-01-23 NOTE — Telephone Encounter (Signed)
Notes faxed.

## 2019-01-23 NOTE — Telephone Encounter (Signed)
Patient called regarding procedure results

## 2019-01-23 NOTE — Telephone Encounter (Signed)
Already addressed

## 2019-01-23 NOTE — Telephone Encounter (Signed)
Patient called stated he needs his records faxed to Elisabeth Most at the [facility]  Fax# [phone]

## 2020-01-15 ENCOUNTER — Ambulatory Visit: Payer: Non-veteran care

## 2020-01-16 ENCOUNTER — Ambulatory Visit: Payer: Medicare Other | Attending: Internal Medicine

## 2020-01-16 DIAGNOSIS — Z23 Encounter for immunization: Secondary | ICD-10-CM | POA: Insufficient documentation

## 2020-01-16 NOTE — Progress Notes (Signed)
   Covid-19 Vaccination Clinic  Name:  RAMONE BLUMENSTOCK    MRN: [id number] DOB: [birth year]  01/16/2020  Mr. Treasure was observed post Covid-19 immunization for 15 minutes without incidence. He was provided with Vaccine Information Sheet and instruction to access the V-Safe system.   Mr. Aldinger was instructed to call 911 with any severe reactions post vaccine: Marland Kitchen Difficulty breathing  . Swelling of your face and throat  . A fast heartbeat  . A bad rash all over your body  . Dizziness and weakness    Immunizations Administered    Name Date Dose VIS Date Route   Pfizer COVID-19 Vaccine 01/16/2020 12:01 PM 0.3 mL 12/07/2019 Intramuscular   Manufacturer: [place]   Lot: [id number]   [place]: [id number]

## 2020-02-04 ENCOUNTER — Ambulatory Visit: Payer: Medicare Other | Attending: Internal Medicine

## 2020-02-04 DIAGNOSIS — Z23 Encounter for immunization: Secondary | ICD-10-CM | POA: Insufficient documentation

## 2020-02-04 NOTE — Progress Notes (Signed)
   Covid-19 Vaccination Clinic  Name:  MIKHAIL TONNER    MRN: [id number] DOB: 1946/04/10  02/04/2020  Mr. Mucker was observed post Covid-19 immunization for 15 minutes without incidence. He was provided with Vaccine Information Sheet and instruction to access the V-Safe system.   Mr. Tuley was instructed to call 911 with any severe reactions post vaccine: Marland Kitchen Difficulty breathing  . Swelling of your face and throat  . A fast heartbeat  . A bad rash all over your body  . Dizziness and weakness    Immunizations Administered    Name Date Dose VIS Date Route   Pfizer COVID-19 Vaccine 02/04/2020  3:09 PM 0.3 mL 12/07/2019 Intramuscular   Manufacturer: [place]   Lot: [id number]   [place]: [id number]

## 2020-03-26 ENCOUNTER — Other Ambulatory Visit: Payer: Self-pay

## 2020-03-26 ENCOUNTER — Encounter: Payer: Self-pay | Admitting: Physical Therapy

## 2020-03-26 ENCOUNTER — Ambulatory Visit: Payer: No Typology Code available for payment source | Attending: Family | Admitting: Physical Therapy

## 2020-03-26 DIAGNOSIS — M6281 Muscle weakness (generalized): Secondary | ICD-10-CM | POA: Diagnosis present

## 2020-03-26 DIAGNOSIS — M542 Cervicalgia: Secondary | ICD-10-CM | POA: Insufficient documentation

## 2020-03-26 NOTE — Therapy (Signed)
[facility] [place], [place], [address] Phone: [phone]   Fax:  [phone]  Physical Therapy Evaluation  Patient Details  Name: Justin Gates MRN: [id number] Date of Birth: June 10, 1946 Referring Provider (PT): Priscille Kluver NP   Encounter Date: 03/26/2020  PT End of Session - 03/26/20 0826    Visit Number  1    Number of Visits  13    Date for PT Re-Evaluation  05/23/20    Authorization Type  VA:  62 visits    Authorization - Visit Number  1    Authorization - Number of Visits  15    PT Start Time  0815    PT Stop Time  0900    PT Time Calculation (min)  45 min    Activity Tolerance  Patient tolerated treatment well    Behavior During Therapy  [facility] for tasks assessed/performed       Past Medical History:  Diagnosis Date  . Dysphagia 11/14/2018  . GERD (gastroesophageal reflux disease)   . High cholesterol   . Hypertension   . Hypothyroid   . 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 biopsy of throat    . ESOPHAGEAL DILATION N/A 01/18/2019   Procedure: ESOPHAGEAL DILATION;  Surgeon: Rogene Houston, MD;  [facility];  Service: Endoscopy;  Laterality: N/A;  . ESOPHAGOGASTRODUODENOSCOPY N/A 01/18/2019   Procedure: ESOPHAGOGASTRODUODENOSCOPY (EGD);  Surgeon: Rogene Houston, MD;  [facility];  Service: Endoscopy;  Laterality: N/A;  1:00  . HEMORRHOID SURGERY      There were no vitals filed for this visit.   Subjective Assessment - 03/26/20 0821    Subjective  Pt arriving to therapy reporting cervical pain on the right side. Pt reporting pain with head turns and popping noted when he's driving his car and turning to look over his shoulder.    Pertinent History  HTN, Laryngeal CA, hyperlipidemia, GERD, dysphagia    Patient Stated Goals  be able to drive a car without pain and turn my head without pain    Currently in Pain?  Yes    Pain Score  3      Pain Location  Neck    Pain Orientation  Right    Pain Descriptors / Indicators  Aching;Sore    Pain Type  Chronic pain    Pain Onset  More than a month ago    Pain Frequency  Constant    Aggravating Factors   truning my head    Pain Relieving Factors  pain meds (0ver the counter)    Effect of Pain on Daily Activities  dirving is difficult         [facility] PT Assessment - 03/26/20 0001      Assessment   Medical Diagnosis  cervicalgia    Referring Provider (PT)  Priscille Kluver NP    Hand Dominance  Right    Prior Therapy  no      Precautions   Precautions  None      Restrictions   Weight Bearing Restrictions  No      Balance Screen   Has the patient fallen in the past 6 months  No    Is the patient reluctant to leave their home because of a fear of falling?   No      Home Film/video editor residence    Living Arrangements  Spouse/significant other    Available Help at Discharge  Family    Type of [place] to enter    Entrance Stairs-Number of Steps  2    Entrance Stairs-Rails  None      Prior Function   Level of Independence  Independent    Vocation  Retired    Leisure  fishing, hunting, sports, gardening      Cognition   Overall Cognitive Status  Within Functional Limits for tasks assessed      Observation/Other Assessments   Focus on Therapeutic Outcomes (FOTO)   39% limitation      ROM / Strength   AROM / PROM / Strength  AROM;Strength      AROM   AROM Assessment Site  Cervical    Cervical Flexion  25    Cervical Extension  18    Cervical - Right Side Bend  15    Cervical - Left Side Bend  10    Cervical - Right Rotation  45    Cervical - Left Rotation  55      Strength   Overall Strength Comments  L grip 80ppsi, R grip: 85 ppsi    Strength Assessment Site  Shoulder    Right/Left Shoulder  Right;Left    Right Shoulder Flexion  4+/5    Right Shoulder Extension  4+/5    Right Shoulder ABduction  4+/5     Right Shoulder Internal Rotation  4+/5    Right Shoulder External Rotation  4+/5    Left Shoulder Flexion  5/5    Left Shoulder Extension  5/5    Left Shoulder ABduction  5/5    Left Shoulder Internal Rotation  5/5    Left Shoulder External Rotation  5/5      Palpation   Palpation comment  TTP R upper trap and mid trap, with active trigger points noted,   discussed DN option for future visit               Objective measurements completed on examination: See above findings.              PT Education - 03/26/20 [id number]    Education Details  PT POC, HEP    Person(s) Educated  Patient    Methods  Explanation;Demonstration;Other (comment)    Comprehension  Verbalized understanding;Returned demonstration          PT Long Term Goals - 03/26/20 0858      PT LONG TERM GOAL #1   Title  Pt will be independent in his HEP and progression.    Time  6    Period  Weeks    Status  New      PT LONG TERM GOAL #2   Title  pt will be able to improve his cerical rotation to 60 degrees bilaterally to help with driving with pain </= 2/10.    Baseline  see flow sheets    Time  6    Period  Weeks    Status  New      PT LONG TERM GOAL #3   Title  Pt will be able to improve his cervical extension to >/= 20 degrees with no pain.    Baseline  see flow sheets    Time  6    Period  Weeks    Status  New             Plan -  03/26/20 1039    Clinical Impression Statement  Pt arriving to therapy with dx of cervicalgia. Pt reporting over a year long history of neck pain. Pt reporting pain today is 3/10 and pt reporting more pain with turning his head to the R and looking upward. Pt with limited cervical ROM with active trigger points noted in R upper and mid trap. Pt could benefit from skilled PT to progress toward improved functional mobility.    Personal Factors and Comorbidities  Comorbidity 3+    Comorbidities  dysphagia, hyperlipidemia, HTN, laryngeal CA     Examination-Activity Limitations  Lift;Carry    Examination-Participation Restrictions  Driving;Yard Work    Stability/Clinical Decision Making  Stable/Uncomplicated    Clinical Decision Making  Low    Rehab Potential  Good    PT Frequency  2x / week    PT Duration  6 weeks    PT Treatment/Interventions  ADLs/Self Care Home Management;Cryotherapy;Electrical Stimulation;Iontophoresis 4mg /ml Dexamethasone;Traction;Ultrasound;Neuromuscular re-education;Therapeutic exercise;Therapeutic activities;Functional mobility training;Patient/family education;Manual techniques;Passive range of motion;Dry needling;Taping    PT Next Visit Plan  IASTM/ STM to cervical spine, cervical mobs, upper and mid trap, UBE, rows, cervical ROM, upper trap stretch, levator stretch, cervical rotation    PT Home Exercise Plan  upper trap stretch, cervical rotation, see pt instructions for access code    Consulted and Agree with Plan of Care  Patient       Patient will benefit from skilled therapeutic intervention in order to improve the following deficits and impairments:  Pain, Decreased strength, Decreased range of motion, Postural dysfunction  Visit Diagnosis: Cervicalgia  Muscle weakness (generalized)     Problem List Patient Active Problem List   Diagnosis Date Noted  . Dysphagia 11/14/2018  . Esophageal dysphagia 11/14/2018    Oretha Caprice, MPT 03/26/2020, 10:57 AM  [facility] [address] [place], [place], [address] Phone: [phone]   Fax:  [phone]  Name: AKILI ANDUJO MRN: [id number] Date of Birth: 26-Apr-1946

## 2020-03-26 NOTE — Patient Instructions (Addendum)
Dry Needling consent form issued to pt to take home and read over.    Access Code: [email or web address] URL: [email or web address] Date: 03/26/2020 Prepared by: Kearney Hard  Exercises Seated Upper Trapezius Stretch - 2-3 x daily - 7 x weekly - 5 reps - 5 seconds hold Seated Cervical Rotation AROM - 2-3 x daily - 7 x weekly - 5 reps - 5 seconds hold

## 2020-03-27 ENCOUNTER — Ambulatory Visit: Payer: No Typology Code available for payment source | Attending: Family | Admitting: Physical Therapy

## 2020-03-27 ENCOUNTER — Encounter: Payer: Self-pay | Admitting: Physical Therapy

## 2020-03-27 DIAGNOSIS — M6281 Muscle weakness (generalized): Secondary | ICD-10-CM

## 2020-03-27 DIAGNOSIS — M542 Cervicalgia: Secondary | ICD-10-CM | POA: Diagnosis present

## 2020-03-27 NOTE — Therapy (Signed)
[facility] [place], [place], [address] Phone: [phone]   Fax:  [phone]  Physical Therapy Treatment  Patient Details  Name: Justin Gates MRN: [id number] Date of Birth: [birth year] Referring Provider (PT): Priscille Kluver NP   Encounter Date: 03/27/2020  PT End of Session - 03/27/20 0839    Visit Number  2    Number of Visits  13    Date for PT Re-Evaluation  05/23/20    Authorization Type  VA:  65 visits    Authorization - Visit Number  2    Authorization - Number of Visits  15    PT Start Time  0815    PT Stop Time  0900    PT Time Calculation (min)  45 min    Activity Tolerance  Patient tolerated treatment well    Behavior During Therapy  [name] for tasks assessed/performed       Past Medical History:  Diagnosis Date  . Dysphagia 11/14/2018  . GERD (gastroesophageal reflux disease)   . High cholesterol   . Hypertension   . Hypothyroid   . 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 biopsy of throat    . ESOPHAGEAL DILATION N/A 01/18/2019   Procedure: ESOPHAGEAL DILATION;  Surgeon: Rogene Houston, MD;  [facility];  Service: Endoscopy;  Laterality: N/A;  . ESOPHAGOGASTRODUODENOSCOPY N/A 01/18/2019   Procedure: ESOPHAGOGASTRODUODENOSCOPY (EGD);  Surgeon: Rogene Houston, MD;  [facility];  Service: Endoscopy;  Laterality: N/A;  1:00  . HEMORRHOID SURGERY      There were no vitals filed for this visit.  Subjective Assessment - 03/27/20 0837    Subjective  COVID-19 screening performed upon arrival. Patient reports feeling "about the same". States the UT stretch got him "sick to my stomach" when he first tried it but when he attempted again this morning he did not have the same reaction.    Pertinent History  HTN, Laryngeal CA, hyperlipidemia, GERD, dysphagia    Patient Stated Goals  be able to drive a car without pain and turn my head without pain     Currently in Pain?  Yes    Pain Score  1     Pain Location  Neck    Pain Orientation  Right    Pain Descriptors / Indicators  Aching;Sore    Pain Type  Chronic pain    Pain Onset  More than a month ago    Pain Frequency  Constant         [facility] PT Assessment - 03/27/20 0001      Assessment   Medical Diagnosis  cervicalgia    Referring Provider (PT)  Priscille Kluver NP    Hand Dominance  Right    Prior Therapy  no      Precautions   Precautions  None                   [facility] Adult PT Treatment/Exercise - 03/27/20 0001      Exercises   Exercises  Neck      Neck Exercises: Machines for Strengthening   UBE (Upper Arm Bike)  120 RPM 6 mins      Manual Therapy   Manual Therapy  Soft tissue mobilization;Passive ROM;Joint mobilization    Joint Mobilization  gentle suboccipital release Grade 2-3 PA mobs of cerivical spine in supine to improve ROM.     Soft  tissue mobilization  STW/M and IASTM to right UT, TPR to decrease tone and pain    Passive ROM  gentle PROM into side flexion and rotation to improve range                  PT Long Term Goals - 03/26/20 0858      PT LONG TERM GOAL #1   Title  Pt will be independent in his HEP and progression.    Time  6    Period  Weeks    Status  New      PT LONG TERM GOAL #2   Title  pt will be able to improve his cerical rotation to 60 degrees bilaterally to help with driving with pain </= 2/10.    Baseline  see flow sheets    Time  6    Period  Weeks    Status  New      PT LONG TERM GOAL #3   Title  Pt will be able to improve his cervical extension to >/= 20 degrees with no pain.    Baseline  see flow sheets    Time  6    Period  Weeks    Status  New            Plan - 03/27/20 0949    Clinical Impression Statement  Patient arrives with mild pain in the cervical spine today. Patient responded well to UBE and manual therapy. Patient noted with right UT trigger points which released with TPR and  IASTM. Patient responded well to PA cervical mobs in supine. Patient reported a decrease of pain at end of session.    Personal Factors and Comorbidities  Comorbidity 3+    Comorbidities  dysphagia, hyperlipidemia, HTN, laryngeal CA    Examination-Activity Limitations  Lift;Carry    Examination-Participation Restrictions  Driving;Yard Work    Stability/Clinical Decision Making  Stable/Uncomplicated    Clinical Decision Making  Low    Rehab Potential  Good    PT Frequency  2x / week    PT Duration  6 weeks    PT Treatment/Interventions  ADLs/Self Care Home Management;Cryotherapy;Electrical Stimulation;Iontophoresis 4mg /ml Dexamethasone;Traction;Ultrasound;Neuromuscular re-education;Therapeutic exercise;Therapeutic activities;Functional mobility training;Patient/family education;Manual techniques;Passive range of motion;Dry needling;Taping    PT Next Visit Plan  IASTM/ STM to cervical spine, cervical mobs, upper and mid trap, UBE, rows, cervical ROM, upper trap stretch, levator stretch, cervical rotation    PT Home Exercise Plan  upper trap stretch, cervical rotation, see pt instructions for access code    Consulted and Agree with Plan of Care  Patient       Patient will benefit from skilled therapeutic intervention in order to improve the following deficits and impairments:  Pain, Decreased strength, Decreased range of motion, Postural dysfunction  Visit Diagnosis: Cervicalgia  Muscle weakness (generalized)     Problem List Patient Active Problem List   Diagnosis Date Noted  . Dysphagia 11/14/2018  . Esophageal dysphagia 11/14/2018    Gabriela Eves, PT, DPT 03/27/2020, 10:43 AM  [facility] [place], [place], [address] Phone: [phone]   Fax:  [phone]  Name: Justin Gates MRN: [id number] Date of Birth: 01-26-1946

## 2020-04-01 ENCOUNTER — Ambulatory Visit: Payer: No Typology Code available for payment source | Admitting: Physical Therapy

## 2020-04-01 ENCOUNTER — Other Ambulatory Visit: Payer: Self-pay

## 2020-04-01 DIAGNOSIS — M542 Cervicalgia: Secondary | ICD-10-CM

## 2020-04-01 DIAGNOSIS — M6281 Muscle weakness (generalized): Secondary | ICD-10-CM

## 2020-04-01 NOTE — Therapy (Signed)
[facility] [place], [place], [address] Phone: [phone]   Fax:  [phone]  Physical Therapy Treatment  Patient Details  Name: Justin Gates MRN: [id number] Date of Birth: 01/19/1946 Referring Provider (PT): Priscille Kluver NP   Encounter Date: 04/01/2020  PT End of Session - 04/01/20 0902    Visit Number  3    Number of Visits  13    Date for PT Re-Evaluation  05/23/20    Authorization Type  VA:  15 visits    PT Start Time  0808    PT Stop Time  0903    PT Time Calculation (min)  55 min    Activity Tolerance  Patient tolerated treatment well    Behavior During Therapy  [facility] for tasks assessed/performed       Past Medical History:  Diagnosis Date  . Dysphagia 11/14/2018  . GERD (gastroesophageal reflux disease)   . High cholesterol   . Hypertension   . Hypothyroid   . 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 biopsy of throat    . ESOPHAGEAL DILATION N/A 01/18/2019   Procedure: ESOPHAGEAL DILATION;  Surgeon: Rogene Houston, MD;  [facility];  Service: Endoscopy;  Laterality: N/A;  . ESOPHAGOGASTRODUODENOSCOPY N/A 01/18/2019   Procedure: ESOPHAGOGASTRODUODENOSCOPY (EGD);  Surgeon: Rogene Houston, MD;  [facility];  Service: Endoscopy;  Laterality: N/A;  1:00  . HEMORRHOID SURGERY      There were no vitals filed for this visit.  Subjective Assessment - 04/01/20 0854    Subjective  COVID-19 screen performed prior to patient entering clinic.  No new complaints.    Pertinent History  HTN, Laryngeal CA, hyperlipidemia, GERD, dysphagia    Patient Stated Goals  be able to drive a car without pain and turn my head without pain    Currently in Pain?  Yes    Pain Score  2     Pain Location  Neck    Pain Orientation  Right    Pain Descriptors / Indicators  Aching;Sore    Pain Type  Chronic pain    Pain Onset  More than a month ago                        [facility] Adult PT Treatment/Exercise - 04/01/20 0001      Modalities   Modalities  Electrical Stimulation;Moist Heat;Ultrasound      Moist Heat Therapy   Number Minutes Moist Heat  20 Minutes    Moist Heat Location  --   Right cervical region.     Ultrasound   Ultrasound Location  Right cervical.    Ultrasound Parameters  Combo e'stim/U/S at 1.50 W/CM2 x 12 minutes.    Ultrasound Goals  Pain      Manual Therapy   Manual Therapy  Soft tissue mobilization    Soft tissue mobilization  STW/M x 12 minutes to affected right cervical musculature x 12 minutes to reduce tone.             PT Education - 04/01/20 0855    Education Details  Instruct in chin tuck and extension.    Person(s) Educated  Patient    Methods  Explanation;Demonstration    Comprehension  Verbalized understanding;Returned demonstration          PT Long Term Goals - 03/26/20 0858      PT LONG  TERM GOAL #1   Title  Pt will be independent in his HEP and progression.    Time  6    Period  Weeks    Status  New      PT LONG TERM GOAL #2   Title  pt will be able to improve his cerical rotation to 60 degrees bilaterally to help with driving with pain </= 2/10.    Baseline  see flow sheets    Time  6    Period  Weeks    Status  New      PT LONG TERM GOAL #3   Title  Pt will be able to improve his cervical extension to >/= 20 degrees with no pain.    Baseline  see flow sheets    Time  6    Period  Weeks    Status  New            Plan - 04/01/20 [id number]    Clinical Impression Statement  Patient reporting no pain after treatment.  He did chin tucks and extension with excellent technique.    Personal Factors and Comorbidities  Comorbidity 3+    Comorbidities  dysphagia, hyperlipidemia, HTN, laryngeal CA    Examination-Activity Limitations  Lift;Carry    Examination-Participation Restrictions  Driving;Yard Work    Stability/Clinical Decision Making   Stable/Uncomplicated    Rehab Potential  Good    PT Frequency  2x / week    PT Duration  6 weeks    PT Treatment/Interventions  ADLs/Self Care Home Management;Cryotherapy;Electrical Stimulation;Iontophoresis 4mg /ml Dexamethasone;Traction;Ultrasound;Neuromuscular re-education;Therapeutic exercise;Therapeutic activities;Functional mobility training;Patient/family education;Manual techniques;Passive range of motion;Dry needling;Taping    PT Next Visit Plan  IASTM/ STM to cervical spine, cervical mobs, upper and mid trap, UBE, rows, cervical ROM, upper trap stretch, levator stretch, cervical rotation    PT Home Exercise Plan  upper trap stretch, cervical rotation, see pt instructions for access code    Consulted and Agree with Plan of Care  Patient       Patient will benefit from skilled therapeutic intervention in order to improve the following deficits and impairments:  Pain, Decreased strength, Decreased range of motion, Postural dysfunction  Visit Diagnosis: Cervicalgia  Muscle weakness (generalized)     Problem List Patient Active Problem List   Diagnosis Date Noted  . Dysphagia 11/14/2018  . Esophageal dysphagia 11/14/2018    Shaconda Hajduk, [place] MPT 04/01/2020, 9:31 AM  [facility] [address] [place], [place], [address] Phone: [phone]   Fax:  [phone]  Name: Justin Gates MRN: [id number] Date of Birth: 27-Mar-1946

## 2020-04-03 ENCOUNTER — Other Ambulatory Visit: Payer: Self-pay

## 2020-04-03 ENCOUNTER — Ambulatory Visit: Payer: No Typology Code available for payment source | Admitting: *Deleted

## 2020-04-03 DIAGNOSIS — M542 Cervicalgia: Secondary | ICD-10-CM

## 2020-04-03 DIAGNOSIS — M6281 Muscle weakness (generalized): Secondary | ICD-10-CM

## 2020-04-03 NOTE — Therapy (Signed)
[facility] [place], [place], [address] Phone: [phone]   Fax:  [phone]  Physical Therapy Treatment  Patient Details  Name: Justin Gates MRN: [id number] Date of Birth: [birth year] Referring Provider (PT): Priscille Kluver NP   Encounter Date: 04/03/2020  PT End of Session - 04/03/20 0856    Visit Number  4    Number of Visits  13    Date for PT Re-Evaluation  05/23/20    Authorization Type  VA:  15 visits    Authorization - Visit Number  4    Authorization - Number of Visits  15    PT Start Time  0815    PT Stop Time  0903    PT Time Calculation (min)  48 min       Past Medical History:  Diagnosis Date  . Dysphagia 11/14/2018  . GERD (gastroesophageal reflux disease)   . High cholesterol   . Hypertension   . Hypothyroid   . 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 biopsy of throat    . ESOPHAGEAL DILATION N/A 01/18/2019   Procedure: ESOPHAGEAL DILATION;  Surgeon: Rogene Houston, MD;  [facility];  Service: Endoscopy;  Laterality: N/A;  . ESOPHAGOGASTRODUODENOSCOPY N/A 01/18/2019   Procedure: ESOPHAGOGASTRODUODENOSCOPY (EGD);  Surgeon: Rogene Houston, MD;  [facility];  Service: Endoscopy;  Laterality: N/A;  1:00  . HEMORRHOID SURGERY      There were no vitals filed for this visit.  Subjective Assessment - 04/03/20 0818    Subjective  COVID-19 screen performed prior to patient entering clinic.  Pain on RT side  2/10    Pertinent History  HTN, Laryngeal CA, hyperlipidemia, GERD, dysphagia    Patient Stated Goals  be able to drive a car without pain and turn my head without pain    Currently in Pain?  Yes    Pain Score  2     Pain Location  Neck    Pain Orientation  Right    Pain Descriptors / Indicators  Aching;Sore    Pain Onset  More than a month ago                       [facility] Adult PT Treatment/Exercise - 04/03/20  0001      Exercises   Exercises  Neck      Modalities   Modalities  Electrical Stimulation;Moist Heat;Ultrasound      Moist Heat Therapy   Number Minutes Moist Heat  18 Minutes    Moist Heat Location  Cervical      Electrical Stimulation   Electrical Stimulation Location  Rt Utrap    Electrical Stimulation Action  premod    Electrical Stimulation Parameters  80-150 hz x 15 mins    Electrical Stimulation Goals  Pain      Ultrasound   Ultrasound Location  Rt Utrap    Ultrasound Parameters  Combo US/estim 1.5 w/cm2 x 12 mins    Ultrasound Goals  Pain      Manual Therapy   Manual Therapy  Soft tissue mobilization    Soft tissue mobilization  STW/ TPR x 12 minutes to affected right cervical musculature x 12 minutes to reduce tone.                  PT Long Term Goals - 03/26/20 ID:[id number]      PT  LONG TERM GOAL #1   Title  Pt will be independent in his HEP and progression.    Time  6    Period  Weeks    Status  New      PT LONG TERM GOAL #2   Title  pt will be able to improve his cerical rotation to 60 degrees bilaterally to help with driving with pain </= 2/10.    Baseline  see flow sheets    Time  6    Period  Weeks    Status  New      PT LONG TERM GOAL #3   Title  Pt will be able to improve his cervical extension to >/= 20 degrees with no pain.    Baseline  see flow sheets    Time  6    Period  Weeks    Status  New            Plan - 04/03/20 0857    Clinical Impression Statement  Pt arrived today doing fairly well and reports Rxs are helping with decreased pain when turning to the Rt. Pt did well with Rx again with decreased TPs  and soreness end of Rx. Normal modality response today Rotation Rt 60  degrees today    Personal Factors and Comorbidities  Comorbidity 3+    Comorbidities  dysphagia, hyperlipidemia, HTN, laryngeal CA    Examination-Activity Limitations  Lift;Carry    Examination-Participation Restrictions  Driving;Yard Work     Stability/Clinical Decision Making  Stable/Uncomplicated    Rehab Potential  Good    PT Frequency  2x / week    PT Duration  6 weeks    PT Treatment/Interventions  ADLs/Self Care Home Management;Cryotherapy;Electrical Stimulation;Iontophoresis 4mg /ml Dexamethasone;Traction;Ultrasound;Neuromuscular re-education;Therapeutic exercise;Therapeutic activities;Functional mobility training;Patient/family education;Manual techniques;Passive range of motion;Dry needling;Taping    PT Next Visit Plan  IASTM/ STM to cervical spine, cervical mobs, upper and mid trap, UBE, rows, cervical ROM, upper trap stretch, levator stretch, cervical rotation    PT Home Exercise Plan  upper trap stretch, cervical rotation, see pt instructions for access code    Consulted and Agree with Plan of Care  Patient       Patient will benefit from skilled therapeutic intervention in order to improve the following deficits and impairments:  Pain, Decreased strength, Decreased range of motion, Postural dysfunction  Visit Diagnosis: Cervicalgia  Muscle weakness (generalized)     Problem List Patient Active Problem List   Diagnosis Date Noted  . Dysphagia 11/14/2018  . Esophageal dysphagia 11/14/2018    Yashas Camilli,[name], PTA 04/03/2020, 9:11 AM  [facility] [place], [place], [address] Phone: [phone]   Fax:  [phone]  Name: Justin Gates MRN: [id number] Date of Birth: 06-30-1946

## 2020-04-08 ENCOUNTER — Encounter: Payer: Self-pay | Admitting: *Deleted

## 2020-04-08 ENCOUNTER — Ambulatory Visit: Payer: No Typology Code available for payment source | Admitting: *Deleted

## 2020-04-08 ENCOUNTER — Other Ambulatory Visit: Payer: Self-pay

## 2020-04-08 DIAGNOSIS — M542 Cervicalgia: Secondary | ICD-10-CM

## 2020-04-08 DIAGNOSIS — M6281 Muscle weakness (generalized): Secondary | ICD-10-CM

## 2020-04-08 NOTE — Therapy (Signed)
[facility] [place], [place], [address] Phone: [phone]   Fax:  [phone]  Physical Therapy Treatment  Patient Details  Name: Justin Gates MRN: [id number] Date of Birth: 07/28/1946 Referring Provider (PT): Priscille Kluver NP   Encounter Date: 04/08/2020  PT End of Session - 04/08/20 0958    Visit Number  5    Number of Visits  13    Date for PT Re-Evaluation  05/23/20    Authorization Type  VA:  15 visits    Authorization - Number of Visits  15    PT Start Time  0815    PT Stop Time  0905    PT Time Calculation (min)  50 min    Activity Tolerance  Patient tolerated treatment well    Behavior During Therapy  [facility] for tasks assessed/performed       Past Medical History:  Diagnosis Date  . Dysphagia 11/14/2018  . GERD (gastroesophageal reflux disease)   . High cholesterol   . Hypertension   . Hypothyroid   . 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 biopsy of throat    . ESOPHAGEAL DILATION N/A 01/18/2019   Procedure: ESOPHAGEAL DILATION;  Surgeon: Rogene Houston, MD;  [facility];  Service: Endoscopy;  Laterality: N/A;  . ESOPHAGOGASTRODUODENOSCOPY N/A 01/18/2019   Procedure: ESOPHAGOGASTRODUODENOSCOPY (EGD);  Surgeon: Rogene Houston, MD;  [facility];  Service: Endoscopy;  Laterality: N/A;  1:00  . HEMORRHOID SURGERY      There were no vitals filed for this visit.  Subjective Assessment - 04/08/20 0817    Subjective  COVID-19 screen performed prior to patient entering clinic.  Pain on RT side  2/10 again    Pertinent History  HTN, Laryngeal CA, hyperlipidemia, GERD, dysphagia    Currently in Pain?  Yes    Pain Score  2     Pain Orientation  Right    Pain Descriptors / Indicators  Aching;Sore                       [facility] Adult PT Treatment/Exercise - 04/08/20 0001      Exercises   Exercises  Neck      Modalities   Modalities  Electrical Stimulation;Moist Heat;Ultrasound      Moist Heat Therapy   Number Minutes Moist Heat  15 Minutes    Moist Heat Location  Cervical      Electrical Stimulation   Electrical Stimulation Location  Rt Utrap seated    Electrical Stimulation Action  premod    Electrical Stimulation Parameters  80-150hz  x 15 mins    Electrical Stimulation Goals  Pain      Ultrasound   Ultrasound Location  RT Utrap    Ultrasound Parameters  combo US/ combo x 12 mins 1.5 w/cm2    Ultrasound Goals  Pain      Manual Therapy   Manual Therapy  Soft tissue mobilization    Soft tissue mobilization  STW/ TPR x 13 minutes to affected right cervical musculature to reduce tone.                  PT Long Term Goals - 03/26/20 0858      PT LONG TERM GOAL #1   Title  Pt will be independent in his HEP and progression.    Time  6    Period  Weeks  Status  New      PT LONG TERM GOAL #2   Title  pt will be able to improve his cerical rotation to 60 degrees bilaterally to help with driving with pain </= 2/10.    Baseline  see flow sheets    Time  6    Period  Weeks    Status  New      PT LONG TERM GOAL #3   Title  Pt will be able to improve his cervical extension to >/= 20 degrees with no pain.    Baseline  see flow sheets    Time  6    Period  Weeks    Status  New            Plan - 04/08/20 0959    Clinical Impression Statement  Pt arrived today doing better with minimal pain 2-3/10 RT side of neck. Rx was performed to Rt side cervical musculature arounnd C5-7 with decreased tightness and soreness. Normal modality response and decreased pain with Rt cevical rotation.    Personal Factors and Comorbidities  Comorbidity 3+    Examination-Activity Limitations  Lift;Carry    Examination-Participation Restrictions  Driving;Yard Work    Stability/Clinical Decision Making  Stable/Uncomplicated    Rehab Potential  Good    PT Frequency  2x / week    PT Duration  6 weeks     PT Treatment/Interventions  ADLs/Self Care Home Management;Cryotherapy;Electrical Stimulation;Iontophoresis 4mg /ml Dexamethasone;Traction;Ultrasound;Neuromuscular re-education;Therapeutic exercise;Therapeutic activities;Functional mobility training;Patient/family education;Manual techniques;Passive range of motion;Dry needling;Taping    PT Next Visit Plan  IASTM/ STM to cervical spine, cervical mobs, upper and mid trap, UBE, rows, cervical ROM, upper trap stretch, levator stretch, cervical rotation    PT Home Exercise Plan  upper trap stretch, cervical rotation, see pt instructions for access code       Patient will benefit from skilled therapeutic intervention in order to improve the following deficits and impairments:  Pain, Decreased strength, Decreased range of motion, Postural dysfunction  Visit Diagnosis: Cervicalgia  Muscle weakness (generalized)     Problem List Patient Active Problem List   Diagnosis Date Noted  . Dysphagia 11/14/2018  . Esophageal dysphagia 11/14/2018    Allia Wiltsey,[name], PTA 04/08/2020, 10:09 AM  [facility] [place], [place], [address] Phone: [phone]   Fax:  [phone]  Name: Justin Gates MRN: [id number] Date of Birth: 1946/11/27

## 2020-04-10 ENCOUNTER — Other Ambulatory Visit: Payer: Self-pay

## 2020-04-10 ENCOUNTER — Ambulatory Visit: Payer: No Typology Code available for payment source | Admitting: *Deleted

## 2020-04-10 DIAGNOSIS — M542 Cervicalgia: Secondary | ICD-10-CM | POA: Diagnosis not present

## 2020-04-10 DIAGNOSIS — M6281 Muscle weakness (generalized): Secondary | ICD-10-CM

## 2020-04-10 NOTE — Therapy (Signed)
[facility] [place], [place], [address] Phone: [phone]   Fax:  [phone]  Physical Therapy Treatment  Patient Details  Name: Justin Gates MRN: [id number] Date of Birth: 1946/03/21 Referring Provider (PT): Priscille Kluver NP   Encounter Date: 04/10/2020  PT End of Session - 04/10/20 0813    Visit Number  6    Number of Visits  13    Date for PT Re-Evaluation  05/23/20    Authorization Type  VA:  33 visits    FOTO    Authorization - Visit Number  4    Authorization - Number of Visits  15    PT Start Time  0815       Past Medical History:  Diagnosis Date  . Dysphagia 11/14/2018  . GERD (gastroesophageal reflux disease)   . High cholesterol   . Hypertension   . Hypothyroid   . 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)     Past Surgical History:  Procedure Laterality Date  . biopsy of throat    . ESOPHAGEAL DILATION N/A 01/18/2019   Procedure: ESOPHAGEAL DILATION;  Surgeon: Rogene Houston, MD;  [facility];  Service: Endoscopy;  Laterality: N/A;  . ESOPHAGOGASTRODUODENOSCOPY N/A 01/18/2019   Procedure: ESOPHAGOGASTRODUODENOSCOPY (EGD);  Surgeon: Rogene Houston, MD;  [facility];  Service: Endoscopy;  Laterality: N/A;  1:00  . HEMORRHOID SURGERY      There were no vitals filed for this visit.  Subjective Assessment - 04/10/20 0808    Subjective  COVID-19 screen performed prior to patient entering clinic.  Pain on RT side  2/10 again    Pertinent History  HTN, Laryngeal CA, hyperlipidemia, GERD, dysphagia    Patient Stated Goals  be able to drive a car without pain and turn my head without pain    Currently in Pain?  Yes    Pain Score  2     Pain Location  Neck    Pain Orientation  Right    Pain Descriptors / Indicators  Aching;Sore    Pain Type  Chronic pain    Pain Onset  More than a month ago                       [facility] Adult PT Treatment/Exercise - 04/10/20 0001      Exercises   Exercises  Neck      Modalities   Modalities  Electrical Stimulation;Moist Heat;Ultrasound      Moist Heat Therapy   Number Minutes Moist Heat  15 Minutes    Moist Heat Location  Cervical      Electrical Stimulation   Electrical Stimulation Location  Rt Utrap seated    Electrical Stimulation Action  premod    Electrical Stimulation Parameters  80-150hz  x 15 mins    Electrical Stimulation Goals  Pain      Ultrasound   Ultrasound Location  RT Utrap/ levator    Ultrasound Parameters  combo [place] at 1.5 w/cm2 x 12 mins    Ultrasound Goals  Pain      Manual Therapy   Manual Therapy  Soft tissue mobilization    Soft tissue mobilization  STW/ TPR x 13 minutes to affected right UT and levator scap. Good realeases today                  PT Long Term Goals - 04/10/20 0856      PT LONG TERM GOAL #1  Title  Pt will be independent in his HEP and progression.    Time  6    Period  Weeks    Status  On-going      PT LONG TERM GOAL #2   Title  pt will be able to improve his cerical rotation to 60 degrees bilaterally to help with driving with pain </= 2/10.    Baseline  see flow sheets    Time  6    Status  Achieved      PT LONG TERM GOAL #3   Title  Pt will be able to improve his cervical extension to >/= 20 degrees with no pain.    Baseline  see flow sheets    Time  6    Period  Weeks    Status  Achieved            Plan - 04/10/20 0858    Clinical Impression Statement  Pt arrived today with minimal pain 2/10 today. He did well with Rx again and his cervical ROM has improved with cervical rotation to LT 68  degrees and RT 70 degrees    . Cervical extension is  50  degrees now compared to  18  at eval    Comorbidities  dysphagia, hyperlipidemia, HTN, laryngeal CA    Examination-Participation Restrictions  Driving;Yard Work    Stability/Clinical Decision Making  Stable/Uncomplicated    Rehab Potential  Good    PT Frequency  2x / week    PT Duration  6  weeks    PT Treatment/Interventions  ADLs/Self Care Home Management;Cryotherapy;Electrical Stimulation;Iontophoresis 4mg /ml Dexamethasone;Traction;Ultrasound;Neuromuscular re-education;Therapeutic exercise;Therapeutic activities;Functional mobility training;Patient/family education;Manual techniques;Passive range of motion;Dry needling;Taping    PT Next Visit Plan  IASTM/ STM to cervical spine, cervical mobs, upper and mid trap, UBE, rows, cervical ROM, upper trap stretch, levator stretch, cervical rotation    PT Home Exercise Plan  upper trap stretch, cervical rotation, see pt instructions for access code       Take FOTO next visit    Consulted and Agree with Plan of Care  Patient       Patient will benefit from skilled therapeutic intervention in order to improve the following deficits and impairments:  Pain, Decreased strength, Decreased range of motion, Postural dysfunction  Visit Diagnosis: Cervicalgia  Muscle weakness (generalized)     Problem List Patient Active Problem List   Diagnosis Date Noted  . Dysphagia 11/14/2018  . Esophageal dysphagia 11/14/2018    Jatia Musa,[name], PTA 04/10/2020, 9:26 AM  [facility] [place], [place], [address] Phone: [phone]   Fax:  [phone]  Name: GUERDON ARAGON MRN: [id number] Date of Birth: 1946/01/01

## 2020-04-15 ENCOUNTER — Ambulatory Visit: Payer: No Typology Code available for payment source | Admitting: Physical Therapy

## 2020-04-15 ENCOUNTER — Other Ambulatory Visit: Payer: Self-pay

## 2020-04-15 DIAGNOSIS — M542 Cervicalgia: Secondary | ICD-10-CM | POA: Diagnosis not present

## 2020-04-15 DIAGNOSIS — M6281 Muscle weakness (generalized): Secondary | ICD-10-CM

## 2020-04-15 NOTE — Therapy (Signed)
[facility] [place], [place], [address] Phone: [phone]   Fax:  [phone]  Physical Therapy Treatment  Patient Details  Name: Justin Gates MRN: [id number] Date of Birth: 1946/11/07 Referring Provider (PT): Priscille Kluver NP   Encounter Date: 04/15/2020  PT End of Session - 04/15/20 0902    Visit Number  7    Number of Visits  13    Date for PT Re-Evaluation  05/23/20    Authorization Type  VA:  24 visits    FOTO    Authorization - Visit Number  4    Authorization - Number of Visits  15    PT Start Time  0815    PT Stop Time  0900    PT Time Calculation (min)  45 min    Activity Tolerance  Patient tolerated treatment well    Behavior During Therapy  [facility] for tasks assessed/performed       Past Medical History:  Diagnosis Date  . Dysphagia 11/14/2018  . GERD (gastroesophageal reflux disease)   . High cholesterol   . Hypertension   . Hypothyroid   . Laryngeal cancer [facility])     Past Surgical History:  Procedure Laterality Date  . biopsy of throat    . ESOPHAGEAL DILATION N/A 01/18/2019   Procedure: ESOPHAGEAL DILATION;  Surgeon: Rogene Houston, MD;  [facility];  Service: Endoscopy;  Laterality: N/A;  . ESOPHAGOGASTRODUODENOSCOPY N/A 01/18/2019   Procedure: ESOPHAGOGASTRODUODENOSCOPY (EGD);  Surgeon: Rogene Houston, MD;  [facility];  Service: Endoscopy;  Laterality: N/A;  1:00  . HEMORRHOID SURGERY      There were no vitals filed for this visit.  Subjective Assessment - 04/15/20 0856    Subjective  COVID-19 screen performed prior to patient entering clinic.  Neck is doing better.    Pertinent History  HTN, Laryngeal CA, hyperlipidemia, GERD, dysphagia    Patient Stated Goals  be able to drive a car without pain and turn my head without pain    Currently in Pain?  Yes    Pain Location  Neck    Pain Orientation  Right    Pain Descriptors / Indicators  Aching;Sore    Pain Type   Chronic pain    Pain Onset  More than a month ago    Pain Frequency  Constant                       [facility] Adult PT Treatment/Exercise - 04/15/20 0001      Modalities   Modalities  Electrical Stimulation;Moist Heat      Moist Heat Therapy   Number Minutes Moist Heat  20 Minutes    Moist Heat Location  Cervical      Electrical Stimulation   Electrical Stimulation Location  RT UT    Electrical Stimulation Action  Pre-mod.    Electrical Stimulation Parameters  80-150 Hz x 20 minutes (5 sec on and 5 sec off).    Electrical Stimulation Goals  Pain      Ultrasound   Ultrasound Location  RT UT    Ultrasound Parameters  Combo e'stim/U/S at 1.50 W/CM2 x 12 minutes.      Manual Therapy   Manual Therapy  Soft tissue mobilization    Soft tissue mobilization  STW/M x 11 minutes.                  PT Long Term Goals -  04/10/20 0856      PT LONG TERM GOAL #1   Title  Pt will be independent in his HEP and progression.    Time  6    Period  Weeks    Status  On-going      PT LONG TERM GOAL #2   Title  pt will be able to improve his cerical rotation to 60 degrees bilaterally to help with driving with pain </= 2/10.    Baseline  see flow sheets    Time  6    Status  Achieved      PT LONG TERM GOAL #3   Title  Pt will be able to improve his cervical extension to >/= 20 degrees with no pain.    Baseline  see flow sheets    Time  6    Period  Weeks    Status  Achieved            Plan - 04/15/20 0906    Clinical Impression Statement  Patient rssponding well to treatments.  Minimal pain today over right UT/Levator scapulae.    Personal Factors and Comorbidities  Comorbidity 3+    Comorbidities  dysphagia, hyperlipidemia, HTN, laryngeal CA    Examination-Activity Limitations  Lift;Carry    Examination-Participation Restrictions  Driving;Yard Work    Stability/Clinical Decision Making  Stable/Uncomplicated    Rehab Potential  Good    PT Frequency  2x /  week    PT Duration  6 weeks    PT Treatment/Interventions  ADLs/Self Care Home Management;Cryotherapy;Electrical Stimulation;Iontophoresis 4mg /ml Dexamethasone;Traction;Ultrasound;Neuromuscular re-education;Therapeutic exercise;Therapeutic activities;Functional mobility training;Patient/family education;Manual techniques;Passive range of motion;Dry needling;Taping    PT Next Visit Plan  IASTM/ STM to cervical spine, cervical mobs, upper and mid trap, UBE, rows, cervical ROM, upper trap stretch, levator stretch, cervical rotation    PT Home Exercise Plan  upper trap stretch, cervical rotation, see pt instructions for access code       Take FOTO next visit    Consulted and Agree with Plan of Care  Patient       Patient will benefit from skilled therapeutic intervention in order to improve the following deficits and impairments:  Pain, Decreased strength, Decreased range of motion, Postural dysfunction  Visit Diagnosis: Cervicalgia  Muscle weakness (generalized)     Problem List Patient Active Problem List   Diagnosis Date Noted  . Dysphagia 11/14/2018  . Esophageal dysphagia 11/14/2018    Naliah Eddington, [place] MPT 04/15/2020, 9:15 AM  [facility] [address] [place], [place], [address] Phone: [phone]   Fax:  [phone]  Name: Justin Gates MRN: [id number] Date of Birth: March 25, 1946

## 2020-04-17 ENCOUNTER — Ambulatory Visit: Payer: No Typology Code available for payment source | Admitting: Physical Therapy

## 2020-04-17 ENCOUNTER — Other Ambulatory Visit: Payer: Self-pay

## 2020-04-17 DIAGNOSIS — M6281 Muscle weakness (generalized): Secondary | ICD-10-CM

## 2020-04-17 DIAGNOSIS — M542 Cervicalgia: Secondary | ICD-10-CM | POA: Diagnosis not present

## 2020-04-17 NOTE — Therapy (Signed)
[facility] [place], [place], [address] Phone: [phone]   Fax:  [phone]  Physical Therapy Treatment  Patient Details  Name: Justin Gates MRN: [id number] Date of Birth: 12-16-1946 Referring Provider (PT): Priscille Kluver NP   Encounter Date: 04/17/2020  PT End of Session - 04/17/20 0949    Visit Number  8    Number of Visits  13    Date for PT Re-Evaluation  05/23/20    Authorization Type  VA:  15 visits    FOTO    PT Start Time  0815    PT Stop Time  0907    PT Time Calculation (min)  52 min    Activity Tolerance  Patient tolerated treatment well    Behavior During Therapy  [facility] for tasks assessed/performed       Past Medical History:  Diagnosis Date  . Dysphagia 11/14/2018  . GERD (gastroesophageal reflux disease)   . High cholesterol   . Hypertension   . Hypothyroid   . 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 biopsy of throat    . ESOPHAGEAL DILATION N/A 01/18/2019   Procedure: ESOPHAGEAL DILATION;  Surgeon: Rogene Houston, MD;  [facility];  Service: Endoscopy;  Laterality: N/A;  . ESOPHAGOGASTRODUODENOSCOPY N/A 01/18/2019   Procedure: ESOPHAGOGASTRODUODENOSCOPY (EGD);  Surgeon: Rogene Houston, MD;  [facility];  Service: Endoscopy;  Laterality: N/A;  1:00  . HEMORRHOID SURGERY      There were no vitals filed for this visit.  Subjective Assessment - 04/17/20 0943    Subjective  COVID-19 screen performed prior to patient entering clinic.  Doing much better.    Pertinent History  HTN, Laryngeal CA, hyperlipidemia, GERD, dysphagia    Patient Stated Goals  be able to drive a car without pain and turn my head without pain    Currently in Pain?  Yes    Pain Score  2     Pain Location  Neck    Pain Orientation  Right    Pain Descriptors / Indicators  Aching;Sore    Pain Type  Chronic pain    Pain Onset  More than a month ago                        [facility] Adult PT Treatment/Exercise - 04/17/20 0001      Modalities   Modalities  Electrical Stimulation;Moist Heat;Ultrasound      Moist Heat Therapy   Number Minutes Moist Heat  20 Minutes    Moist Heat Location  Cervical      Electrical Stimulation   Electrical Stimulation Location  Right UT.    Electrical Stimulation Action  Pre-mod.    Electrical Stimulation Parameters  80-150 Hz x 20 minutes.    Electrical Stimulation Goals  Pain      Ultrasound   Ultrasound Location  Right UT.    Ultrasound Parameters  Combo e'sim/U/S at 1.50 W/CM2 x 12 minutes.      Manual Therapy   Manual Therapy  Soft tissue mobilization    Soft tissue mobilization  STW/M x 11 minutes.                  PT Long Term Goals - 04/10/20 0856      PT LONG TERM GOAL #1   Title  Pt will be independent in his HEP and progression.  Time  6    Period  Weeks    Status  On-going      PT LONG TERM GOAL #2   Title  pt will be able to improve his cerical rotation to 60 degrees bilaterally to help with driving with pain </= 2/10.    Baseline  see flow sheets    Time  6    Status  Achieved      PT LONG TERM GOAL #3   Title  Pt will be able to improve his cervical extension to >/= 20 degrees with no pain.    Baseline  see flow sheets    Time  6    Period  Weeks    Status  Achieved            Plan - 04/17/20 0951    Clinical Impression Statement  Patient pleased with progress and states his pain has been staying low.    Personal Factors and Comorbidities  Comorbidity 3+    Comorbidities  dysphagia, hyperlipidemia, HTN, laryngeal CA    Examination-Activity Limitations  Lift;Carry    Examination-Participation Restrictions  Driving;Yard Work    Stability/Clinical Decision Making  Stable/Uncomplicated    Rehab Potential  Good    PT Frequency  2x / week    PT Duration  6 weeks    PT Treatment/Interventions  ADLs/Self Care Home  Management;Cryotherapy;Electrical Stimulation;Iontophoresis 4mg /ml Dexamethasone;Traction;Ultrasound;Neuromuscular re-education;Therapeutic exercise;Therapeutic activities;Functional mobility training;Patient/family education;Manual techniques;Passive range of motion;Dry needling;Taping    PT Next Visit Plan  IASTM/ STM to cervical spine, cervical mobs, upper and mid trap, UBE, rows, cervical ROM, upper trap stretch, levator stretch, cervical rotation    PT Home Exercise Plan  upper trap stretch, cervical rotation, see pt instructions for access code       Take FOTO next visit    Consulted and Agree with Plan of Care  Patient       Patient will benefit from skilled therapeutic intervention in order to improve the following deficits and impairments:  Pain, Decreased strength, Decreased range of motion, Postural dysfunction  Visit Diagnosis: Cervicalgia  Muscle weakness (generalized)     Problem List Patient Active Problem List   Diagnosis Date Noted  . Dysphagia 11/14/2018  . Esophageal dysphagia 11/14/2018    Justin Gates, Justin Gates 04/17/2020, 9:55 AM  [facility] [address] [place], [place], [address] Phone: [phone]   Fax:  [phone]  Name: Justin Gates MRN: [id number] Date of Birth: 1946-03-22

## 2020-04-22 ENCOUNTER — Encounter: Payer: No Typology Code available for payment source | Admitting: Physical Therapy

## 2020-04-24 ENCOUNTER — Ambulatory Visit: Payer: No Typology Code available for payment source | Admitting: *Deleted

## 2020-04-24 ENCOUNTER — Other Ambulatory Visit: Payer: Self-pay

## 2020-04-24 DIAGNOSIS — M542 Cervicalgia: Secondary | ICD-10-CM

## 2020-04-24 DIAGNOSIS — M6281 Muscle weakness (generalized): Secondary | ICD-10-CM

## 2020-04-24 NOTE — Therapy (Signed)
[facility] [place], [place], [address] Phone: [phone]   Fax:  [phone]  Physical Therapy Treatment  Patient Details  Name: Justin Gates MRN: [id number] Date of Birth: [birth year] Referring Provider (PT): Priscille Kluver NP   Encounter Date: 04/24/2020  PT End of Session - 04/24/20 0847    Visit Number  9    Number of Visits  13    Date for PT Re-Evaluation  05/23/20    Authorization Type  VA:  15 visits    FOTO 9th visit 22%    PT Start Time  0815    PT Stop Time  0904    PT Time Calculation (min)  49 min       Past Medical History:  Diagnosis Date  . Dysphagia 11/14/2018  . GERD (gastroesophageal reflux disease)   . High cholesterol   . Hypertension   . Hypothyroid   . 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 biopsy of throat    . ESOPHAGEAL DILATION N/A 01/18/2019   Procedure: ESOPHAGEAL DILATION;  Surgeon: Rogene Houston, MD;  [facility];  Service: Endoscopy;  Laterality: N/A;  . ESOPHAGOGASTRODUODENOSCOPY N/A 01/18/2019   Procedure: ESOPHAGOGASTRODUODENOSCOPY (EGD);  Surgeon: Rogene Houston, MD;  [facility];  Service: Endoscopy;  Laterality: N/A;  1:00  . HEMORRHOID SURGERY      There were no vitals filed for this visit.                    [facility] Adult PT Treatment/Exercise - 04/24/20 0001      Modalities   Modalities  Electrical Stimulation;Moist Heat;Ultrasound      Moist Heat Therapy   Number Minutes Moist Heat  15 Minutes    Moist Heat Location  Cervical      Electrical Stimulation   Electrical Stimulation Location  Right UT.    Electrical Stimulation Action  premod    Electrical Stimulation Parameters  80-150hz  x 15 mins    Electrical Stimulation Goals  Pain      Ultrasound   Ultrasound Location  RT UT and levator    Ultrasound Parameters  [place] combo x 12 mins 1.5 w/cm2     Ultrasound Goals  Pain       Manual Therapy   Manual Therapy  Soft tissue mobilization    Soft tissue mobilization  STW and TPR to RT UT and levator with pT SITTING                  PT Long Term Goals - 04/10/20 0856      PT LONG TERM GOAL #1   Title  Pt will be independent in his HEP and progression.    Time  6    Period  Weeks    Status  On-going      PT LONG TERM GOAL #2   Title  pt will be able to improve his cerical rotation to 60 degrees bilaterally to help with driving with pain </= 2/10.    Baseline  see flow sheets    Time  6    Status  Achieved      PT LONG TERM GOAL #3   Title  Pt will be able to improve his cervical extension to >/= 20 degrees with no pain.    Baseline  see flow sheets    Time  6    Period  Weeks    Status  Achieved            Plan - 04/24/20 0847    Clinical Impression Statement  Patient arrives today doing much better with ROM and low pain levels. He can turn his head to the LT 65 degrees and 68 degrees to the RT. Decreased TPs in RT Utrap with good releases. Normal modality release.    Personal Factors and Comorbidities  Comorbidity 3+    Comorbidities  dysphagia, hyperlipidemia, HTN, laryngeal CA    Examination-Activity Limitations  Lift;Carry    Examination-Participation Restrictions  Driving;Yard Work    Stability/Clinical Decision Making  Stable/Uncomplicated    Rehab Potential  Good    PT Frequency  2x / week    PT Duration  6 weeks    PT Treatment/Interventions  ADLs/Self Care Home Management;Cryotherapy;Electrical Stimulation;Iontophoresis 4mg /ml Dexamethasone;Traction;Ultrasound;Neuromuscular re-education;Therapeutic exercise;Therapeutic activities;Functional mobility training;Patient/family education;Manual techniques;Passive range of motion;Dry needling;Taping    PT Next Visit Plan  IASTM/ STM to cervical spine, cervical mobs, upper and mid trap, UBE, rows, cervical ROM, upper trap stretch, levator stretch, cervical rotation    PT Home Exercise  Plan  upper trap stretch, cervical rotation, see pt instructions for access code    Consulted and Agree with Plan of Care  Patient       Patient will benefit from skilled therapeutic intervention in order to improve the following deficits and impairments:  Pain, Decreased strength, Decreased range of motion, Postural dysfunction  Visit Diagnosis: Cervicalgia  Muscle weakness (generalized)     Problem List Patient Active Problem List   Diagnosis Date Noted  . Dysphagia 11/14/2018  . Esophageal dysphagia 11/14/2018    Jeanett Antonopoulos,[name], PTA 04/24/2020, 9:42 AM  [facility] [place], [place], [address] Phone: [phone]   Fax:  [phone]  Name: Justin Gates MRN: [id number] Date of Birth: 1946/03/31

## 2020-04-29 ENCOUNTER — Ambulatory Visit: Payer: No Typology Code available for payment source | Attending: Family | Admitting: *Deleted

## 2020-04-29 ENCOUNTER — Other Ambulatory Visit: Payer: Self-pay

## 2020-04-29 DIAGNOSIS — M6281 Muscle weakness (generalized): Secondary | ICD-10-CM | POA: Insufficient documentation

## 2020-04-29 DIAGNOSIS — M542 Cervicalgia: Secondary | ICD-10-CM | POA: Diagnosis present

## 2020-04-29 NOTE — Therapy (Signed)
[facility] [place], [place], [address] Phone: [phone]   Fax:  [phone]  Physical Therapy Treatment  Patient Details  Name: Justin Gates MRN: [id number] Date of Birth: 10-Jun-1946 Referring Provider (PT): Priscille Kluver NP   Encounter Date: 04/29/2020  PT End of Session - 04/29/20 0817    Visit Number  10    Number of Visits  13    Date for PT Re-Evaluation  05/23/20    Authorization Type  VA:  15 visits    FOTO 9th visit 22%    Authorization - Visit Number  10    Authorization - Number of Visits  15    PT Start Time  0815    PT Stop Time  0904    PT Time Calculation (min)  49 min       Past Medical History:  Diagnosis Date  . Dysphagia 11/14/2018  . GERD (gastroesophageal reflux disease)   . High cholesterol   . Hypertension   . Hypothyroid   . 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 biopsy of throat    . ESOPHAGEAL DILATION N/A 01/18/2019   Procedure: ESOPHAGEAL DILATION;  Surgeon: Rogene Houston, MD;  [facility];  Service: Endoscopy;  Laterality: N/A;  . ESOPHAGOGASTRODUODENOSCOPY N/A 01/18/2019   Procedure: ESOPHAGOGASTRODUODENOSCOPY (EGD);  Surgeon: Rogene Houston, MD;  [facility];  Service: Endoscopy;  Laterality: N/A;  1:00  . HEMORRHOID SURGERY      There were no vitals filed for this visit.  Subjective Assessment - 04/29/20 0851    Subjective  COVID-19 screen performed prior to patient entering clinic.  Minimal pain . Just tightness    Pertinent History  HTN, Laryngeal CA, hyperlipidemia, GERD, dysphagia    Patient Stated Goals  be able to drive a car without pain and turn my head without pain    Currently in Pain?  Yes    Pain Score  2     Pain Location  Neck    Pain Orientation  Right    Pain Descriptors / Indicators  Tightness;Sore    Pain Type  Chronic pain    Pain Onset  More than a month ago                        [facility] Adult PT Treatment/Exercise - 04/29/20 0001      Modalities   Modalities  Electrical Stimulation;Moist Heat;Ultrasound      Moist Heat Therapy   Number Minutes Moist Heat  15 Minutes    Moist Heat Location  Cervical      Electrical Stimulation   Electrical Stimulation Location  RT UT/mid trap    Electrical Stimulation Action  premod    Electrical Stimulation Parameters  80-hz -150 x 15 mins    Electrical Stimulation Goals  Pain      Ultrasound   Ultrasound Location  RT Mid/ Utrap    Ultrasound Parameters  Combo [place] at 1.5 w/cm2  x 12 mins    Ultrasound Goals  Pain      Manual Therapy   Manual Therapy  Soft tissue mobilization    Soft tissue mobilization  STW and TPR to RT mid and  UT as well as levator with pT SITTING                  PT Long Term Goals - 04/10/20  [id number]      PT LONG TERM GOAL #1   Title  Pt will be independent in his HEP and progression.    Time  6    Period  Weeks    Status  On-going      PT LONG TERM GOAL #2   Title  pt will be able to improve his cerical rotation to 60 degrees bilaterally to help with driving with pain </= 2/10.    Baseline  see flow sheets    Time  6    Status  Achieved      PT LONG TERM GOAL #3   Title  Pt will be able to improve his cervical extension to >/= 20 degrees with no pain.    Baseline  see flow sheets    Time  6    Period  Weeks    Status  Achieved            Plan - 04/29/20 0857    Clinical Impression Statement  Pt arrived today doing better overall with PT and only c/o tightness and soreness in RT side of his neck. Pt did great with Rx and feels he will be ready to DC after next Rx. TPs hAVE REDUCED AND ONLY MILD TENDERNESS NOTED WITH stw/tpr. nORMAL MODALIUTY RESPONSE.    Personal Factors and Comorbidities  Comorbidity 3+    Comorbidities  dysphagia, hyperlipidemia, HTN, laryngeal CA    Examination-Activity Limitations  Lift;Carry     Examination-Participation Restrictions  Driving;Yard Work    Stability/Clinical Decision Making  Stable/Uncomplicated    Rehab Potential  Good    PT Frequency  2x / week    PT Duration  6 weeks    PT Treatment/Interventions  ADLs/Self Care Home Management;Cryotherapy;Electrical Stimulation;Iontophoresis 4mg /ml Dexamethasone;Traction;Ultrasound;Neuromuscular re-education;Therapeutic exercise;Therapeutic activities;Functional mobility training;Patient/family education;Manual techniques;Passive range of motion;Dry needling;Taping    PT Next Visit Plan  DC after next Rx    PT Home Exercise Plan  upper trap stretch, cervical rotation, see pt instructions for access code    Consulted and Agree with Plan of Care  Patient       Patient will benefit from skilled therapeutic intervention in order to improve the following deficits and impairments:  Pain, Decreased strength, Decreased range of motion, Postural dysfunction  Visit Diagnosis: Cervicalgia  Muscle weakness (generalized)     Problem List Patient Active Problem List   Diagnosis Date Noted  . Dysphagia 11/14/2018  . Esophageal dysphagia 11/14/2018    Dayton Kenley,[name] , pta 04/29/2020, 9:39 AM  [facility] [address] [place], [place], [address] Phone: [phone]   Fax:  [phone]  Name: Justin Gates MRN: [id number] Date of Birth: January 21, 1946

## 2020-05-01 ENCOUNTER — Other Ambulatory Visit: Payer: Self-pay

## 2020-05-01 ENCOUNTER — Ambulatory Visit: Payer: No Typology Code available for payment source | Admitting: *Deleted

## 2020-05-01 DIAGNOSIS — M542 Cervicalgia: Secondary | ICD-10-CM | POA: Diagnosis not present

## 2020-05-01 DIAGNOSIS — M6281 Muscle weakness (generalized): Secondary | ICD-10-CM

## 2020-05-01 NOTE — Therapy (Signed)
[facility] [place], [place], [address] Phone: [phone]   Fax:  [phone]  Physical Therapy Treatment  Patient Details  Name: Justin Gates MRN: [id number] Date of Birth: 04/25/1946 Referring Provider (PT): Priscille Kluver NP   Encounter Date: 05/01/2020  PT End of Session - 05/01/20 0851    Visit Number  11    Number of Visits  13    Authorization Type  VA:  15 visits    FOTO 9th visit 22%    Authorization - Visit Number  11    Authorization - Number of Visits  15    PT Start Time  0815    PT Stop Time  0902    PT Time Calculation (min)  47 min       Past Medical History:  Diagnosis Date  . Dysphagia 11/14/2018  . GERD (gastroesophageal reflux disease)   . High cholesterol   . Hypertension   . Hypothyroid   . 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 biopsy of throat    . ESOPHAGEAL DILATION N/A 01/18/2019   Procedure: ESOPHAGEAL DILATION;  Surgeon: Rogene Houston, MD;  [facility];  Service: Endoscopy;  Laterality: N/A;  . ESOPHAGOGASTRODUODENOSCOPY N/A 01/18/2019   Procedure: ESOPHAGOGASTRODUODENOSCOPY (EGD);  Surgeon: Rogene Houston, MD;  [facility];  Service: Endoscopy;  Laterality: N/A;  1:00  . HEMORRHOID SURGERY      There were no vitals filed for this visit.  Subjective Assessment - 05/01/20 0817    Subjective  COVID-19 screen performed prior to patient entering clinic. DC today. Doing good    Pertinent History  HTN, Laryngeal CA, hyperlipidemia, GERD, dysphagia    Currently in Pain?  Yes    Pain Score  1     Pain Location  Neck    Pain Orientation  Right    Pain Descriptors / Indicators  Tightness    Pain Type  Chronic pain    Pain Onset  More than a month ago                       [facility] Adult PT Treatment/Exercise - 05/01/20 0001      Neck Exercises: Seated   Neck Retraction  10 reps    Cervical Rotation   Both;10 reps      Modalities   Modalities  Electrical Stimulation;Moist Heat;Ultrasound      Moist Heat Therapy   Number Minutes Moist Heat  15 Minutes    Moist Heat Location  Cervical      Electrical Stimulation   Electrical Stimulation Location  RT UT/mid trap    Electrical Stimulation Action  premod    Electrical Stimulation Parameters  80-'150hz'$  x 15 mins    Electrical Stimulation Goals  Pain      Ultrasound   Ultrasound Location  RT Utrap/ levator    Ultrasound Parameters  combo [place] at 1.5 w/cm2 x10 mins    Ultrasound Goals  Pain      Manual Therapy   Manual Therapy  Soft tissue mobilization    Soft tissue mobilization  STW and TPR to RT mid and  UT as well as levator with sitting                  PT Long Term Goals - 05/01/20 0852      PT LONG TERM GOAL #1  Title  Pt will be independent in his HEP and progression.    Time  6    Period  Weeks    Status  Achieved      PT LONG TERM GOAL #2   Title  pt will be able to improve his cerical rotation to 60 degrees bilaterally to help with driving with pain </= 2/10.    Period  Weeks    Status  Achieved      PT LONG TERM GOAL #3   Title  Pt will be able to improve his cervical extension to >/= 20 degrees with no pain.    Period  Weeks    Status  Achieved            Plan - 05/01/20 7225    Clinical Impression Statement  Pt arrived today doing well and reports no new problems and feels that Rxs have really helped. He had Utrap trigger point that was mildly tender during STW, but released during session. He has met all LTGs and feels ready for DC and continue with HEP for maintenance.    Comorbidities  dysphagia, hyperlipidemia, HTN, laryngeal CA    Examination-Activity Limitations  Lift;Carry    Examination-Participation Restrictions  Driving;Yard Work    Rehab Potential  Good    PT Frequency  2x / week    PT Duration  6 weeks    PT Treatment/Interventions  ADLs/Self Care Home  Management;Cryotherapy;Electrical Stimulation;Iontophoresis '4mg'$ /ml Dexamethasone;Traction;Ultrasound;Neuromuscular re-education;Therapeutic exercise;Therapeutic activities;Functional mobility training;Patient/family education;Manual techniques;Passive range of motion;Dry needling;Taping    PT Next Visit Plan  DC  to HEP    PT Home Exercise Plan  upper trap stretch, cervical rotation, see pt instructions for access code    Consulted and Agree with Plan of Care  Patient       Patient will benefit from skilled therapeutic intervention in order to improve the following deficits and impairments:  Pain, Decreased strength, Decreased range of motion, Postural dysfunction  Visit Diagnosis: Muscle weakness (generalized)  Cervicalgia     Problem List Patient Active Problem List   Diagnosis Date Noted  . Dysphagia 11/14/2018  . Esophageal dysphagia 11/14/2018    Jyla Hopf,[name], PTA 05/01/2020, 9:37 AM  [facility] [address] [place], [place], [address] Phone: [phone]   Fax:  [phone]  Name: Justin Gates MRN: [id number] Date of Birth: 01-01-1946  PHYSICAL THERAPY DISCHARGE SUMMARY  Visits from Start of Care: 11.  Current functional level related to goals / functional outcomes: See above.   Remaining deficits: All goals met.   Education / Equipment: HEP. Plan: Patient agrees to discharge.  Patient goals were not met. Patient is being discharged due to meeting the stated rehab goals.  ?????         Mali Applegate MPT

## 2021-10-08 ENCOUNTER — Encounter (INDEPENDENT_AMBULATORY_CARE_PROVIDER_SITE_OTHER): Payer: Self-pay | Admitting: *Deleted

## 2021-10-22 DIAGNOSIS — M5459 Other low back pain: Secondary | ICD-10-CM | POA: Diagnosis not present

## 2021-10-22 DIAGNOSIS — M5451 Vertebrogenic low back pain: Secondary | ICD-10-CM | POA: Diagnosis not present

## 2021-11-05 DIAGNOSIS — M5451 Vertebrogenic low back pain: Secondary | ICD-10-CM | POA: Diagnosis not present

## 2021-11-11 DIAGNOSIS — M5416 Radiculopathy, lumbar region: Secondary | ICD-10-CM | POA: Diagnosis not present

## 2021-12-09 ENCOUNTER — Encounter (INDEPENDENT_AMBULATORY_CARE_PROVIDER_SITE_OTHER): Payer: Self-pay

## 2021-12-09 ENCOUNTER — Telehealth (INDEPENDENT_AMBULATORY_CARE_PROVIDER_SITE_OTHER): Payer: Self-pay

## 2021-12-09 ENCOUNTER — Other Ambulatory Visit (INDEPENDENT_AMBULATORY_CARE_PROVIDER_SITE_OTHER): Payer: Self-pay

## 2021-12-09 DIAGNOSIS — Z86010 Personal history of colonic polyps: Secondary | ICD-10-CM

## 2021-12-09 MED ORDER — PEG 3350-KCL-NA BICARB-NACL 420 G PO SOLR: 4000.0000 mL | ORAL | 0 refills | Status: DC

## 2021-12-09 NOTE — Telephone Encounter (Signed)
Justin Gates, CMA  

## 2021-12-09 NOTE — Telephone Encounter (Signed)
Referring MD/PCP: [facility]  Procedure: Tcs  Reason/Indication:  History of Polyps  Has patient had this procedure before?  Yes, 2016  If so, when, by whom and where?    Is there a family history of colon cancer?  no  Who?  What age when diagnosed?    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  no  Do you have a pacemaker/defibrillator?  no  Has patient ever had endocarditis/atrial fibrillation? no  Does patient use oxygen? no  Has patient had joint replacement within last 12 months?  no  Is patient constipated or do they take laxatives? yes  Does patient have a history of alcohol/drug use?  no  Have you had a stroke/heart attack last 6 mths? no  Do you take medicine for weight loss?  no  For male patients,: do you still have your menstrual cycle? N/A  Is patient on blood thinner such as Coumadin, Plavix and/or Aspirin? yes  Medications: asa 81 mg daily, MVI daily, famotidine 10 mg daily, levothyroxine 137 mg daily, psyllium 1 tsp daily, atorvastatin 40 mg daily, carboxmethylcellulose eye drops daily, metformin 500 mg bid, amlodipine 10 mg 1/2 tab daily, guaifenesin 600 mg bid   Allergies: nkda  Medication Adjustment per Dr Laural Golden HOLD ASA 2 DAYS PRIOR AND NO METFORMIN THE EVENING PRIOR OR THE MORNING OF YOUR PROCEDURE  Procedure date & time: Wednesday 01/06/22 at 12:00

## 2021-12-31 NOTE — Patient Instructions (Addendum)
Justin Gates  12/31/2021     @PREFPERIOPPHARMACY @   Your procedure is scheduled on  01/06/2022.    Report to [facility] at  1030  A.M.   Call this number if you have problems the morning of surgery:  [phone]     Your last dose of aspirin should be taken on 01/03/2022.    DO NOT take any metformin the night before or the morning of your procedure.    Remember:  Follow the diet and prep instructions given to you by the office.    Take these medicines the morning of surgery with A SIP OF WATER                     amlodipine, pepcid, levothyroxine.     Do not wear jewelry, make-up or nail polish.  Do not wear lotions, powders, or perfumes, or deodorant.  Do not shave 48 hours prior to surgery.  Men may shave face and neck.  Do not bring valuables to the hospital.  [facility] is not responsible for any belongings or valuables.  Contacts, dentures or bridgework may not be worn into surgery.  Leave your suitcase in the car.  After surgery it may be brought to your room.  For patients admitted to the hospital, discharge time will be determined by your treatment team.  Patients discharged the day of surgery will not be allowed to drive home and must have someone with them for 24 hours.    Special instructions:   DO NOT smoke tobacco or vape for 24 hours before your procedure.  Please read over the following fact sheets that you were given. Anesthesia Post-op Instructions and Care and Recovery After Surgery      Colonoscopy, Adult, Care After This sheet gives you information about how to care for yourself after your procedure. Your health care provider may also give you more specific instructions. If you have problems or questions, contact your health care provider. What can I expect after the procedure? After the procedure, it is common to have: A small amount of blood in your stool for 24 hours after the procedure. Some gas. Mild cramping or bloating of  your abdomen. Follow these instructions at home: Eating and drinking  Drink enough fluid to keep your urine pale yellow. Follow instructions from your health care provider about eating or drinking restrictions. Resume your normal diet as instructed by your health care provider. Avoid heavy or fried foods that are hard to digest. Activity Rest as told by your health care provider. Avoid sitting for a long time without moving. Get up to take short walks every 1-2 hours. This is important to improve blood flow and breathing. Ask for help if you feel weak or unsteady. Return to your normal activities as told by your health care provider. Ask your health care provider what activities are safe for you. Managing cramping and bloating  Try walking around when you have cramps or feel bloated. Apply heat to your abdomen as told by your health care provider. Use the heat source that your health care provider recommends, such as a moist heat pack or a heating pad. Place a towel between your skin and the heat source. Leave the heat on for 20-30 minutes. Remove the heat if your skin turns bright red. This is especially important if you are unable to feel pain, heat, or cold. You may have a greater risk of getting burned.  General instructions If you were given a sedative during the procedure, it can affect you for several hours. Do not drive or operate machinery until your health care provider says that it is safe. For the first 24 hours after the procedure: Do not sign important documents. Do not drink alcohol. Do your regular daily activities at a slower pace than normal. Eat soft foods that are easy to digest. Take over-the-counter and prescription medicines only as told by your health care provider. Keep all follow-up visits as told by your health care provider. This is important. Contact a health care provider if: You have blood in your stool 2-3 days after the procedure. Get help right away if  you have: More than a small spotting of blood in your stool. Large blood clots in your stool. Swelling of your abdomen. Nausea or vomiting. A fever. Increasing pain in your abdomen that is not relieved with medicine. Summary After the procedure, it is common to have a small amount of blood in your stool. You may also have mild cramping and bloating of your abdomen. If you were given a sedative during the procedure, it can affect you for several hours. Do not drive or operate machinery until your health care provider says that it is safe. Get help right away if you have a lot of blood in your stool, nausea or vomiting, a fever, or increased pain in your abdomen. This information is not intended to replace advice given to you by your health care provider. Make sure you discuss any questions you have with your health care provider. Document Revised: 10/19/2019 Document Reviewed: 07/09/2019 Elsevier Patient Education  [place] After This sheet gives you information about how to care for yourself after your procedure. Your health care provider may also give you more specific instructions. If you have problems or questions, contact your health care provider. What can I expect after the procedure? After the procedure, it is common to have: Tiredness. Forgetfulness about what happened after the procedure. Impaired judgment for important decisions. Nausea or vomiting. Some difficulty with balance. Follow these instructions at home: For the time period you were told by your health care provider:   Rest as needed. Do not participate in activities where you could fall or become injured. Do not drive or use machinery. Do not drink alcohol. Do not take sleeping pills or medicines that cause drowsiness. Do not make important decisions or sign legal documents. Do not take care of children on your own. Eating and drinking Follow the diet that is recommended  by your health care provider. Drink enough fluid to keep your urine pale yellow. If you vomit: Drink water, juice, or soup when you can drink without vomiting. Make sure you have little or no nausea before eating solid foods. General instructions Have a responsible adult stay with you for the time you are told. It is important to have someone help care for you until you are awake and alert. Take over-the-counter and prescription medicines only as told by your health care provider. If you have sleep apnea, surgery and certain medicines can increase your risk for breathing problems. Follow instructions from your health care provider about wearing your sleep device: Anytime you are sleeping, including during daytime naps. While taking prescription pain medicines, sleeping medicines, or medicines that make you drowsy. Avoid smoking. Keep all follow-up visits as told by your health care provider. This is important. Contact a health care provider if: You keep  feeling nauseous or you keep vomiting. You feel light-headed. You are still sleepy or having trouble with balance after 24 hours. You develop a rash. You have a fever. You have redness or swelling around the IV site. Get help right away if: You have trouble breathing. You have new-onset confusion at home. Summary For several hours after your procedure, you may feel tired. You may also be forgetful and have poor judgment. Have a responsible adult stay with you for the time you are told. It is important to have someone help care for you until you are awake and alert. Rest as told. Do not drive or operate machinery. Do not drink alcohol or take sleeping pills. Get help right away if you have trouble breathing, or if you suddenly become confused. This information is not intended to replace advice given to you by your health care provider. Make sure you discuss any questions you have with your health care provider. Document Revised: 08/28/2020  Document Reviewed: 11/15/2019 Elsevier Patient Education  2022 [employer].

## 2022-01-04 ENCOUNTER — Encounter (HOSPITAL_COMMUNITY)
Admission: RE | Admit: 2022-01-04 | Discharge: 2022-01-04 | Disposition: A | Discharge status: Home / Self Care | Payer: No Typology Code available for payment source | Source: Ambulatory Visit | Attending: Internal Medicine | Admitting: Internal Medicine

## 2022-01-04 ENCOUNTER — Encounter (HOSPITAL_COMMUNITY): Payer: Self-pay

## 2022-01-04 VITALS — BP 138/69 | HR 76 | Temp 97.8°F | Resp 18 | Ht 70.05 in | Wt 190.0 lb

## 2022-01-04 DIAGNOSIS — Z01818 Encounter for other preprocedural examination: Secondary | ICD-10-CM | POA: Diagnosis present

## 2022-01-04 DIAGNOSIS — E119 Type 2 diabetes mellitus without complications: Secondary | ICD-10-CM | POA: Insufficient documentation

## 2022-01-04 DIAGNOSIS — Z86010 Personal history of colonic polyps: Secondary | ICD-10-CM

## 2022-01-04 HISTORY — DX: Other complications of anesthesia, initial encounter: T88.59XA

## 2022-01-04 LAB — BASIC METABOLIC PANEL
Anion gap: 5 (ref 5–15)
BUN: 18 mg/dL (ref 8–23)
CO2: 27 mmol/L (ref 22–32)
Calcium: 8.6 mg/dL — ABNORMAL LOW (ref 8.9–10.3)
Chloride: 105 mmol/L (ref 98–111)
Creatinine, Ser: 1.04 mg/dL (ref 0.61–1.24)
GFR, Estimated: >60 mL/min (ref >60)
Glucose, Bld: 147 mg/dL — ABNORMAL HIGH (ref 70–99)
Potassium: 4.7 mmol/L (ref 3.5–5.1)
Sodium: 137 mmol/L (ref 135–145)

## 2022-01-06 ENCOUNTER — Ambulatory Visit (HOSPITAL_COMMUNITY): Payer: No Typology Code available for payment source | Admitting: Certified Registered Nurse Anesthetist

## 2022-01-06 ENCOUNTER — Encounter (HOSPITAL_COMMUNITY): Payer: Self-pay | Admitting: Internal Medicine

## 2022-01-06 ENCOUNTER — Encounter (HOSPITAL_COMMUNITY)
Admission: RE | Disposition: A | Discharge status: Home / Self Care | Payer: Self-pay | Source: Ambulatory Visit | Attending: Internal Medicine

## 2022-01-06 ENCOUNTER — Ambulatory Visit (HOSPITAL_COMMUNITY)
Admission: RE | Admit: 2022-01-06 | Discharge: 2022-01-06 | Disposition: A | Discharge status: Home / Self Care | Payer: No Typology Code available for payment source | Source: Ambulatory Visit | Attending: Internal Medicine | Admitting: Internal Medicine

## 2022-01-06 DIAGNOSIS — Z87891 Personal history of nicotine dependence: Secondary | ICD-10-CM | POA: Insufficient documentation

## 2022-01-06 DIAGNOSIS — Z09 Encounter for follow-up examination after completed treatment for conditions other than malignant neoplasm: Secondary | ICD-10-CM | POA: Diagnosis not present

## 2022-01-06 DIAGNOSIS — E039 Hypothyroidism, unspecified: Secondary | ICD-10-CM | POA: Insufficient documentation

## 2022-01-06 DIAGNOSIS — I10 Essential (primary) hypertension: Secondary | ICD-10-CM | POA: Insufficient documentation

## 2022-01-06 DIAGNOSIS — K648 Other hemorrhoids: Secondary | ICD-10-CM | POA: Insufficient documentation

## 2022-01-06 DIAGNOSIS — Z1211 Encounter for screening for malignant neoplasm of colon: Secondary | ICD-10-CM | POA: Diagnosis not present

## 2022-01-06 DIAGNOSIS — K5730 Diverticulosis of large intestine without perforation or abscess without bleeding: Secondary | ICD-10-CM | POA: Insufficient documentation

## 2022-01-06 DIAGNOSIS — D123 Benign neoplasm of transverse colon: Secondary | ICD-10-CM | POA: Diagnosis not present

## 2022-01-06 DIAGNOSIS — Z86010 Personal history of colonic polyps: Secondary | ICD-10-CM | POA: Diagnosis not present

## 2022-01-06 DIAGNOSIS — Z79899 Other long term (current) drug therapy: Secondary | ICD-10-CM | POA: Diagnosis not present

## 2022-01-06 DIAGNOSIS — K219 Gastro-esophageal reflux disease without esophagitis: Secondary | ICD-10-CM | POA: Diagnosis not present

## 2022-01-06 DIAGNOSIS — K635 Polyp of colon: Secondary | ICD-10-CM | POA: Diagnosis not present

## 2022-01-06 DIAGNOSIS — K644 Residual hemorrhoidal skin tags: Secondary | ICD-10-CM | POA: Diagnosis not present

## 2022-01-06 DIAGNOSIS — Z79890 Hormone replacement therapy (postmenopausal): Secondary | ICD-10-CM | POA: Diagnosis not present

## 2022-01-06 HISTORY — PX: COLONOSCOPY WITH PROPOFOL: SHX5780

## 2022-01-06 HISTORY — PX: POLYPECTOMY: SHX5525

## 2022-01-06 LAB — GLUCOSE, CAPILLARY: Glucose-Capillary: 116 mg/dL — ABNORMAL HIGH (ref 70–99)

## 2022-01-06 SURGERY — COLONOSCOPY WITH PROPOFOL
Anesthesia: General

## 2022-01-06 MED ORDER — EPHEDRINE SULFATE-NACL 50-0.9 MG/10ML-% IV SOSY
PREFILLED_SYRINGE | INTRAVENOUS | Status: DC | PRN
Start: 2022-01-06 — End: 2022-01-06
  Administered 2022-01-06: 5 mg via INTRAVENOUS
  Administered 2022-01-06: 10 mg via INTRAVENOUS
  Administered 2022-01-06 (×3): 5 mg via INTRAVENOUS
  Administered 2022-01-06: 10 mg via INTRAVENOUS

## 2022-01-06 MED ORDER — PROPOFOL 10 MG/ML IV BOLUS
INTRAVENOUS | Status: DC | PRN
Start: 2022-01-06 — End: 2022-01-06
  Administered 2022-01-06: 50 mg via INTRAVENOUS

## 2022-01-06 MED ORDER — PROPOFOL 500 MG/50ML IV EMUL
INTRAVENOUS | Status: AC
  Filled 2022-01-06: qty 200

## 2022-01-06 MED ORDER — EPHEDRINE 5 MG/ML INJ
INTRAVENOUS | Status: AC
  Filled 2022-01-06: qty 5

## 2022-01-06 MED ORDER — PROPOFOL 500 MG/50ML IV EMUL
INTRAVENOUS | Status: DC | PRN
  Administered 2022-01-06: 150 ug/kg/min via INTRAVENOUS

## 2022-01-06 MED ORDER — PROPOFOL 500 MG/50ML IV EMUL
INTRAVENOUS | Status: AC
  Filled 2022-01-06: qty 50

## 2022-01-06 MED ORDER — LACTATED RINGERS IV SOLN: INTRAVENOUS | Status: DC | PRN

## 2022-01-06 MED ORDER — LACTATED RINGERS IV SOLN: INTRAVENOUS | Status: DC

## 2022-01-06 NOTE — Anesthesia Preprocedure Evaluation (Signed)
Anesthesia Evaluation  Patient identified by MRN, date of birth, ID band Patient awake    Reviewed: Allergy & Precautions, H&P , NPO status , Patient's Chart, lab work & pertinent test results, reviewed documented beta blocker date and time   History of Anesthesia Complications (+) PROLONGED EMERGENCE and history of anesthetic complications  Airway Mallampati: II  TM Distance: >3 FB Neck ROM: full    Dental no notable dental hx.    Pulmonary neg pulmonary ROS, former smoker,    Pulmonary exam normal breath sounds clear to auscultation       Cardiovascular Exercise Tolerance: Good hypertension, negative cardio ROS   Rhythm:regular Rate:Normal     Neuro/Psych negative neurological ROS  negative psych ROS   GI/Hepatic Neg liver ROS, GERD  Medicated,  Endo/Other  Hypothyroidism   Renal/GU negative Renal ROS  negative genitourinary   Musculoskeletal   Abdominal   Peds  Hematology negative hematology ROS (+)   Anesthesia Other Findings   Reproductive/Obstetrics negative OB ROS                             Anesthesia Physical Anesthesia Plan  ASA: 2  Anesthesia Plan: General   Post-op Pain Management:    Induction:   PONV Risk Score and Plan: Propofol infusion  Airway Management Planned:   Additional Equipment:   Intra-op Plan:   Post-operative Plan:   Informed Consent: I have reviewed the patients History and Physical, chart, labs and discussed the procedure including the risks, benefits and alternatives for the proposed anesthesia with the patient or authorized representative who has indicated his/her understanding and acceptance.     Dental Advisory Given  Plan Discussed with: CRNA  Anesthesia Plan Comments:         Anesthesia Quick Evaluation

## 2022-01-06 NOTE — Transfer of Care (Signed)
Immediate Anesthesia Transfer of Care Note  Patient: Justin Gates  Procedure(s) Performed: COLONOSCOPY WITH PROPOFOL POLYPECTOMY  Patient Location: PACU  Anesthesia Type:General  Level of Consciousness: awake, alert  and oriented  Airway & Oxygen Therapy: Patient Spontanous Breathing  Post-op Assessment: Report given to RN, Patient moving all extremities X 4 and Patient able to stick tongue midline  Post vital signs: Reviewed  Last Vitals:  Vitals Value Taken Time  BP 93/55 01/06/22 0800  Temp 36.9 C 01/06/22 0800  Pulse 69 01/06/22 0800  Resp 19 01/06/22 0800  SpO2 100 % 01/06/22 0800    Last Pain:  Vitals:   01/06/22 0800  TempSrc: Oral  PainSc: 0-No pain      Patients Stated Pain Goal: 5 (16/10/96 0454)  Complications: No notable events documented.

## 2022-01-06 NOTE — Discharge Instructions (Addendum)
Resume aspirin on 01/07/2022 Resume other medications as before Modified carb high-fiber diet. No driving for 24 hours. Physician will call with biopsy results.

## 2022-01-06 NOTE — H&P (Signed)
Justin Gates is an 76 y.o. male.   Chief Complaint: Patient is here for colonoscopy. HPI: Patient is 76 year old Caucasian male who has history of colonic polyps and is here for surveillance colonoscopy.  Last exam was in 2016.  He denies abdominal pain change in bowel habits or rectal bleeding.  His appetite is good and his weight has been stable.  Last aspirin dose was 3 days ago. Family history is negative for colonic polyps colon carcinoma.  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  Past Medical History:  Diagnosis Date   Complication of anesthesia    difficult to wake up after anesthesia   Dysphagia 11/14/2018   GERD (gastroesophageal reflux disease)    High cholesterol    Hypertension    Hypothyroid    Laryngeal cancer (HCC)     Past Surgical History:  Procedure Laterality Date   biopsy of throat     ESOPHAGEAL DILATION N/A 01/18/2019   Procedure: ESOPHAGEAL DILATION;  Surgeon: Rogene Houston, MD;  Location: [facility];  Service: Endoscopy;  Laterality: N/A;   ESOPHAGOGASTRODUODENOSCOPY N/A 01/18/2019   Procedure: ESOPHAGOGASTRODUODENOSCOPY (EGD);  Surgeon: Rogene Houston, MD;  Location: [facility];  Service: Endoscopy;  Laterality: N/A;  1:00   HEMORRHOID SURGERY      History reviewed. No pertinent family history. Social History:  reports that he quit smoking about 25 years ago. His smoking use included cigarettes. He has never used smokeless tobacco. He reports that he does not currently use drugs. He reports that he does not drink alcohol.  Allergies: No Known Allergies  Medications Prior to Admission  Medication Sig Dispense Refill   amLODipine (NORVASC) 10 MG tablet Take 5 mg by mouth in the morning.     aspirin EC 81 MG tablet Take 81 mg by mouth daily at 3 pm.      atorvastatin (LIPITOR) 40 MG tablet Take 40 mg by mouth every evening.      carboxymethylcellulose (REFRESH PLUS) 0.5 % SOLN Place 2 drops into both eyes in  the morning and at bedtime.     famotidine (PEPCID) 10 MG tablet Take 10 mg by mouth in the morning.     Garlic 2595 MG CAPS Take 1,000 mg by mouth every evening.      guaiFENesin (MUCINEX) 600 MG 12 hr tablet Take 1,200 mg by mouth 2 (two) times daily.     levothyroxine (SYNTHROID, LEVOTHROID) 137 MCG tablet Take 137 mcg by mouth daily before breakfast.     metFORMIN (GLUCOPHAGE) 500 MG tablet Take 500 mg by mouth 2 (two) times daily with a meal.     Multiple Vitamin (MULTIVITAMIN WITH MINERALS) TABS tablet Take 1 tablet by mouth daily. MULTIVITAMIN FOR MEN 50+     polyethylene glycol-electrolytes (TRILYTE) 420 g solution Take 4,000 mLs by mouth as directed. 4000 mL 0   PSYLLIUM HUSK PO Take 4 g by mouth daily with lunch.     TEA TREE OIL EX Apply 1 application topically every Monday. MIX WITH BABY LOTION (APPLY TO EYE ONCE A WEEK)      Results for orders placed or performed during the hospital encounter of 01/06/22 (from the past 48 hour(s))  Glucose, capillary     Status: Abnormal   Collection Time: 01/06/22  6:27 AM  Result Value Ref Range   Glucose-Capillary 116 (H) 70 - 99 mg/dL    Comment: Glucose reference range applies only to samples taken after fasting for  at least 8 hours.   No results found.  Review of Systems  Blood pressure 140/85, pulse 72, temperature 98.2 F (36.8 C), temperature source Oral, resp. rate 18, SpO2 98 %. Physical Exam HENT:     Mouth/Throat:     Mouth: Mucous membranes are moist.     Pharynx: Oropharynx is clear.  Eyes:     General: No scleral icterus.    Conjunctiva/sclera: Conjunctivae normal.  Cardiovascular:     Rate and Rhythm: Normal rate and regular rhythm.  Pulmonary:     Effort: Pulmonary effort is normal.     Breath sounds: Normal breath sounds.  Abdominal:     Comments: Abdomen is full.  Small umbilical hernia which is reducible.  Abdomen is soft and nontender with organomegaly or masses.  Musculoskeletal:        General: No  swelling.     Cervical back: Neck supple.  Lymphadenopathy:     Cervical: No cervical adenopathy.  Skin:    General: Skin is warm and dry.  Neurological:     Mental Status: He is alert.     Assessment/Plan  History of colonic polyps. Surveillance colonoscopy.  Hildred Laser, MD 01/06/2022, 7:23 AM

## 2022-01-06 NOTE — Op Note (Signed)
[facility] Patient Name: Justin Gates Procedure Date: 01/06/2022 7:26 AM MRN: [id number] Date of Birth: July 23, 1946 Attending MD: Hildred Laser , MD CSN: [id number] Age: 76 Admit Type: Outpatient Procedure:                Colonoscopy Indications:              High risk colon cancer surveillance: Personal                            history of colonic polyps Providers:                Hildred Laser, MD, Lambert Mody, Aram Candela Referring MD:             Fraser Din, MD Medicines:                Propofol per Anesthesia Complications:            No immediate complications. Estimated Blood Loss:     Estimated blood loss was minimal. Procedure:                Pre-Anesthesia Assessment:                           - Prior to the procedure, a History and Physical                            was performed, and patient medications and                            allergies were reviewed. The patient's tolerance of                            previous anesthesia was also reviewed. The risks                            and benefits of the procedure and the sedation                            options and risks were discussed with the patient.                            All questions were answered, and informed consent                            was obtained. Prior Anticoagulants: The patient has                            taken no previous anticoagulant or antiplatelet                            agents except for aspirin. ASA Grade Assessment: II                            - A patient with mild systemic disease. After  reviewing the risks and benefits, the patient was                            deemed in satisfactory condition to undergo the                            procedure.                           After obtaining informed consent, the colonoscope                            was passed under direct vision. Throughout the                            procedure, the  patient's blood pressure, pulse, and                            oxygen saturations were monitored continuously. The                            [id number]-HQ190L ([id number]) scope was introduced through                            the anus and advanced to the the cecum, identified                            by appendiceal orifice and ileocecal valve. The                            colonoscopy was performed without difficulty. The                            patient tolerated the procedure well. The quality                            of the bowel preparation was excellent. The                            ileocecal valve, appendiceal orifice, and rectum                            were photographed. Scope In: 7:32:37 AM Scope Out: 7:52:30 AM Scope Withdrawal Time: 0 hours 10 minutes 1 second  Total Procedure Duration: 0 hours 19 minutes 53 seconds  Findings:      Skin tags were found on perianal exam.      Two polyps were found in the transverse colon. The polyps were 4 to 6 mm       in size. These polyps were removed with a cold snare. Resection and       retrieval were complete. The pathology specimen was placed into Bottle       Number 1.      Multiple diverticula were found in the sigmoid colon.      External and internal hemorrhoids were found during retroflexion. The  hemorrhoids were small.      Scar noted at distal rectum from prior polypectomy. Impression:               - Perianal skin tags found on perianal exam.                           - Two 4 to 6 mm polyps in the transverse colon,                            removed with a cold snare. Resected and retrieved.                           - Diverticulosis in the sigmoid colon.                           - External and internal hemorrhoids.                           - Distal rectal scar from prior polypectomy. Moderate Sedation:      Per Anesthesia Care Recommendation:           - Patient has a contact number available for                             emergencies. The signs and symptoms of potential                            delayed complications were discussed with the                            patient. Return to normal activities tomorrow.                            Written discharge instructions were provided to the                            patient.                           - High fiber diet and diabetic (ADA) diet today.                           - Continue present medications.                           - No aspirin, ibuprofen, naproxen, or other                            non-steroidal anti-inflammatory drugs for 1 day.                           - Await pathology results.                           - Repeat colonoscopy is recommended. The  colonoscopy date will be determined after pathology                            results from today's exam become available for                            review. Procedure Code(s):        --- Professional ---                           [phone], Colonoscopy, flexible; with removal of                            tumor(s), polyp(s), or other lesion(s) by snare                            technique Diagnosis Code(s):        --- Professional ---                           Z86.010, Personal history of colonic polyps                           K63.5, Polyp of colon                           K64.8, Other hemorrhoids                           K64.4, Residual hemorrhoidal skin tags                           K57.30, Diverticulosis of large intestine without                            perforation or abscess without bleeding CPT copyright 2019 American Medical Association. All rights reserved. The codes documented in this report are preliminary and upon coder review may  be revised to meet current compliance requirements. Hildred Laser, MD Hildred Laser, MD 01/06/2022 8:01:04 AM This report has been signed electronically. Number of Addenda: 0

## 2022-01-06 NOTE — Anesthesia Postprocedure Evaluation (Signed)
Anesthesia Post Note  Patient: Justin Gates  Procedure(s) Performed: COLONOSCOPY WITH PROPOFOL POLYPECTOMY  Patient location during evaluation: Phase II Anesthesia Type: General Level of consciousness: awake Pain management: pain level controlled Vital Signs Assessment: post-procedure vital signs reviewed and stable Respiratory status: spontaneous breathing and respiratory function stable Cardiovascular status: blood pressure returned to baseline and stable Postop Assessment: no headache and no apparent nausea or vomiting Anesthetic complications: no Comments: Late entry   No notable events documented.   Last Vitals:  Vitals:   01/06/22 0800 01/06/22 0810  BP: (!) 93/55 (!) 106/59  Pulse: 69   Resp: 19   Temp: 36.9 C   SpO2: 100%     Last Pain:  Vitals:   01/06/22 0800  TempSrc: Oral  PainSc: 0-No pain                 Louann Sjogren

## 2022-01-08 ENCOUNTER — Encounter (HOSPITAL_COMMUNITY): Payer: Self-pay | Admitting: Internal Medicine

## 2022-01-12 LAB — SURGICAL PATHOLOGY

## 2022-09-03 DIAGNOSIS — R070 Pain in throat: Secondary | ICD-10-CM | POA: Diagnosis not present

## 2022-09-03 DIAGNOSIS — J029 Acute pharyngitis, unspecified: Secondary | ICD-10-CM | POA: Diagnosis not present

## 2023-09-15 DIAGNOSIS — Z8521 Personal history of malignant neoplasm of larynx: Secondary | ICD-10-CM | POA: Diagnosis not present

## 2023-09-15 DIAGNOSIS — R490 Dysphonia: Secondary | ICD-10-CM | POA: Diagnosis not present
